# Patient Record
Sex: Female | Born: 1960 | Race: White | Hispanic: No | Smoking: Never smoker
Health system: Southern US, Community
[De-identification: ages and names within clinical notes are randomized; demographics above are authoritative.]

## PROBLEM LIST (undated history)

## (undated) DIAGNOSIS — D61818 Other pancytopenia: Secondary | ICD-10-CM

## (undated) DIAGNOSIS — B1920 Unspecified viral hepatitis C without hepatic coma: Secondary | ICD-10-CM

## (undated) DIAGNOSIS — R531 Weakness: Secondary | ICD-10-CM

## (undated) DIAGNOSIS — K219 Gastro-esophageal reflux disease without esophagitis: Secondary | ICD-10-CM

## (undated) DIAGNOSIS — Z9119 Patient's noncompliance with other medical treatment and regimen: Secondary | ICD-10-CM

## (undated) DIAGNOSIS — G5601 Carpal tunnel syndrome, right upper limb: Secondary | ICD-10-CM

## (undated) DIAGNOSIS — B180 Chronic viral hepatitis B with delta-agent: Secondary | ICD-10-CM

## (undated) DIAGNOSIS — K7460 Unspecified cirrhosis of liver: Secondary | ICD-10-CM

## (undated) DIAGNOSIS — R569 Unspecified convulsions: Secondary | ICD-10-CM

## (undated) DIAGNOSIS — E079 Disorder of thyroid, unspecified: Secondary | ICD-10-CM

## (undated) DIAGNOSIS — F1910 Other psychoactive substance abuse, uncomplicated: Secondary | ICD-10-CM

## (undated) DIAGNOSIS — I10 Essential (primary) hypertension: Secondary | ICD-10-CM

## (undated) DIAGNOSIS — I679 Cerebrovascular disease, unspecified: Secondary | ICD-10-CM

## (undated) DIAGNOSIS — F419 Anxiety disorder, unspecified: Secondary | ICD-10-CM

## (undated) DIAGNOSIS — F99 Mental disorder, not otherwise specified: Secondary | ICD-10-CM

## (undated) DIAGNOSIS — N181 Chronic kidney disease, stage 1: Secondary | ICD-10-CM

## (undated) DIAGNOSIS — D649 Anemia, unspecified: Secondary | ICD-10-CM

## (undated) DIAGNOSIS — L0390 Cellulitis, unspecified: Secondary | ICD-10-CM

## (undated) DIAGNOSIS — Z91199 Patient's noncompliance with other medical treatment and regimen due to unspecified reason: Secondary | ICD-10-CM

## (undated) HISTORY — PX: TONSILLECTOMY: SUR1361

## (undated) HISTORY — PX: ABDOMINAL HYSTERECTOMY: SHX81

## (undated) HISTORY — PX: FINGER AMPUTATION: SHX636

---

## 2015-08-13 ENCOUNTER — Emergency Department (HOSPITAL_COMMUNITY): Payer: Medicaid Other

## 2015-08-13 ENCOUNTER — Encounter (HOSPITAL_COMMUNITY): Payer: Self-pay

## 2015-08-13 ENCOUNTER — Inpatient Hospital Stay (HOSPITAL_COMMUNITY)
Admission: EM | Admit: 2015-08-13 | Discharge: 2015-08-18 | DRG: 101 | Disposition: A | Discharge status: Skilled Nursing Facility | Payer: Medicaid Other | Attending: Internal Medicine | Admitting: Internal Medicine

## 2015-08-13 DIAGNOSIS — F1410 Cocaine abuse, uncomplicated: Secondary | ICD-10-CM | POA: Diagnosis present

## 2015-08-13 DIAGNOSIS — G40401 Other generalized epilepsy and epileptic syndromes, not intractable, with status epilepticus: Principal | ICD-10-CM | POA: Diagnosis present

## 2015-08-13 DIAGNOSIS — D61818 Other pancytopenia: Secondary | ICD-10-CM | POA: Diagnosis present

## 2015-08-13 DIAGNOSIS — K739 Chronic hepatitis, unspecified: Secondary | ICD-10-CM | POA: Diagnosis present

## 2015-08-13 DIAGNOSIS — Z881 Allergy status to other antibiotic agents status: Secondary | ICD-10-CM

## 2015-08-13 DIAGNOSIS — G40909 Epilepsy, unspecified, not intractable, without status epilepticus: Secondary | ICD-10-CM

## 2015-08-13 DIAGNOSIS — Z888 Allergy status to other drugs, medicaments and biological substances status: Secondary | ICD-10-CM

## 2015-08-13 DIAGNOSIS — G8384 Todd's paralysis (postepileptic): Secondary | ICD-10-CM | POA: Diagnosis present

## 2015-08-13 DIAGNOSIS — Z8614 Personal history of Methicillin resistant Staphylococcus aureus infection: Secondary | ICD-10-CM

## 2015-08-13 DIAGNOSIS — Z9119 Patient's noncompliance with other medical treatment and regimen: Secondary | ICD-10-CM

## 2015-08-13 DIAGNOSIS — Z792 Long term (current) use of antibiotics: Secondary | ICD-10-CM

## 2015-08-13 DIAGNOSIS — K7469 Other cirrhosis of liver: Secondary | ICD-10-CM | POA: Diagnosis present

## 2015-08-13 DIAGNOSIS — Z89022 Acquired absence of left finger(s): Secondary | ICD-10-CM

## 2015-08-13 DIAGNOSIS — F1910 Other psychoactive substance abuse, uncomplicated: Secondary | ICD-10-CM | POA: Diagnosis present

## 2015-08-13 DIAGNOSIS — Z8619 Personal history of other infectious and parasitic diseases: Secondary | ICD-10-CM

## 2015-08-13 DIAGNOSIS — R4182 Altered mental status, unspecified: Secondary | ICD-10-CM | POA: Diagnosis present

## 2015-08-13 DIAGNOSIS — Z880 Allergy status to penicillin: Secondary | ICD-10-CM

## 2015-08-13 DIAGNOSIS — B180 Chronic viral hepatitis B with delta-agent: Secondary | ICD-10-CM | POA: Diagnosis present

## 2015-08-13 DIAGNOSIS — F419 Anxiety disorder, unspecified: Secondary | ICD-10-CM | POA: Diagnosis present

## 2015-08-13 DIAGNOSIS — I679 Cerebrovascular disease, unspecified: Secondary | ICD-10-CM | POA: Diagnosis present

## 2015-08-13 DIAGNOSIS — R569 Unspecified convulsions: Secondary | ICD-10-CM | POA: Insufficient documentation

## 2015-08-13 DIAGNOSIS — Z79891 Long term (current) use of opiate analgesic: Secondary | ICD-10-CM

## 2015-08-13 DIAGNOSIS — K219 Gastro-esophageal reflux disease without esophagitis: Secondary | ICD-10-CM | POA: Diagnosis present

## 2015-08-13 DIAGNOSIS — F4489 Other dissociative and conversion disorders: Secondary | ICD-10-CM | POA: Diagnosis not present

## 2015-08-13 DIAGNOSIS — Z7982 Long term (current) use of aspirin: Secondary | ICD-10-CM

## 2015-08-13 DIAGNOSIS — Z87892 Personal history of anaphylaxis: Secondary | ICD-10-CM

## 2015-08-13 DIAGNOSIS — R9089 Other abnormal findings on diagnostic imaging of central nervous system: Secondary | ICD-10-CM

## 2015-08-13 DIAGNOSIS — I739 Peripheral vascular disease, unspecified: Secondary | ICD-10-CM | POA: Diagnosis present

## 2015-08-13 DIAGNOSIS — R29810 Facial weakness: Secondary | ICD-10-CM | POA: Diagnosis present

## 2015-08-13 DIAGNOSIS — E039 Hypothyroidism, unspecified: Secondary | ICD-10-CM | POA: Diagnosis present

## 2015-08-13 DIAGNOSIS — I10 Essential (primary) hypertension: Secondary | ICD-10-CM | POA: Diagnosis present

## 2015-08-13 HISTORY — DX: Essential (primary) hypertension: I10

## 2015-08-13 HISTORY — DX: Mental disorder, not otherwise specified: F99

## 2015-08-13 HISTORY — DX: Chronic viral hepatitis B with delta-agent: K74.60

## 2015-08-13 HISTORY — DX: Patient's noncompliance with other medical treatment and regimen due to unspecified reason: Z91.199

## 2015-08-13 HISTORY — DX: Carpal tunnel syndrome, right upper limb: G56.01

## 2015-08-13 HISTORY — DX: Unspecified viral hepatitis C without hepatic coma: B19.20

## 2015-08-13 HISTORY — DX: Cellulitis, unspecified: L03.90

## 2015-08-13 HISTORY — DX: Weakness: R53.1

## 2015-08-13 HISTORY — DX: Patient's noncompliance with other medical treatment and regimen: Z91.19

## 2015-08-13 HISTORY — DX: Chronic kidney disease, stage 1: N18.1

## 2015-08-13 HISTORY — DX: Anemia, unspecified: D64.9

## 2015-08-13 HISTORY — DX: Anxiety disorder, unspecified: F41.9

## 2015-08-13 HISTORY — DX: Gastro-esophageal reflux disease without esophagitis: K21.9

## 2015-08-13 HISTORY — DX: Disorder of thyroid, unspecified: E07.9

## 2015-08-13 HISTORY — DX: Chronic viral hepatitis B with delta-agent: B18.0

## 2015-08-13 HISTORY — DX: Cerebrovascular disease, unspecified: I67.9

## 2015-08-13 HISTORY — DX: Other pancytopenia: D61.818

## 2015-08-13 HISTORY — DX: Unspecified convulsions: R56.9

## 2015-08-13 HISTORY — DX: Other psychoactive substance abuse, uncomplicated: F19.10

## 2015-08-13 LAB — RAPID URINE DRUG SCREEN, HOSP PERFORMED
Amphetamines: NOT DETECTED
BARBITURATES: NOT DETECTED
Benzodiazepines: POSITIVE — AB
Cocaine: NOT DETECTED
Opiates: NOT DETECTED
TETRAHYDROCANNABINOL: NOT DETECTED

## 2015-08-13 LAB — DIFFERENTIAL
BASOS ABS: 0 10*3/uL (ref 0.0–0.1)
BASOS PCT: 1 %
Eosinophils Absolute: 0.1 10*3/uL (ref 0.0–0.7)
Eosinophils Relative: 5 %
Lymphocytes Relative: 39 %
Lymphs Abs: 0.7 10*3/uL (ref 0.7–4.0)
MONOS PCT: 14 %
Monocytes Absolute: 0.2 10*3/uL (ref 0.1–1.0)
NEUTROS ABS: 0.8 10*3/uL — AB (ref 1.7–7.7)
Neutrophils Relative %: 41 %
SMEAR REVIEW: DECREASED

## 2015-08-13 LAB — CBC
HEMATOCRIT: 26.2 % — AB (ref 36.0–46.0)
HEMOGLOBIN: 8.4 g/dL — AB (ref 12.0–15.0)
MCH: 30.9 pg (ref 26.0–34.0)
MCHC: 32.1 g/dL (ref 30.0–36.0)
MCV: 96.3 fL (ref 78.0–100.0)
Platelets: 77 10*3/uL — ABNORMAL LOW (ref 150–400)
RBC: 2.72 MIL/uL — ABNORMAL LOW (ref 3.87–5.11)
RDW: 16.8 % — ABNORMAL HIGH (ref 11.5–15.5)
WBC: 1.8 10*3/uL — ABNORMAL LOW (ref 4.0–10.5)

## 2015-08-13 LAB — URINALYSIS, ROUTINE W REFLEX MICROSCOPIC
Bilirubin Urine: NEGATIVE
Glucose, UA: NEGATIVE mg/dL
Hgb urine dipstick: NEGATIVE
KETONES UR: NEGATIVE mg/dL
LEUKOCYTES UA: NEGATIVE
NITRITE: NEGATIVE
PROTEIN: NEGATIVE mg/dL
Specific Gravity, Urine: 1.02 (ref 1.005–1.030)
pH: 6.5 (ref 5.0–8.0)

## 2015-08-13 LAB — COMPREHENSIVE METABOLIC PANEL
ALT: 36 U/L (ref 14–54)
AST: 27 U/L (ref 15–41)
Albumin: 3.3 g/dL — ABNORMAL LOW (ref 3.5–5.0)
Alkaline Phosphatase: 71 U/L (ref 38–126)
Anion gap: 5 (ref 5–15)
BUN: 16 mg/dL (ref 6–20)
CHLORIDE: 111 mmol/L (ref 101–111)
CO2: 25 mmol/L (ref 22–32)
CREATININE: 0.78 mg/dL (ref 0.44–1.00)
Calcium: 8.7 mg/dL — ABNORMAL LOW (ref 8.9–10.3)
GFR calc Af Amer: >60 mL/min (ref >60)
GLUCOSE: 102 mg/dL — AB (ref 65–99)
Potassium: 3.8 mmol/L (ref 3.5–5.1)
SODIUM: 141 mmol/L (ref 135–145)
Total Bilirubin: 0.5 mg/dL (ref 0.3–1.2)
Total Protein: 6.5 g/dL (ref 6.5–8.1)

## 2015-08-13 LAB — TROPONIN I

## 2015-08-13 LAB — TYPE AND SCREEN
ABO/RH(D): A POS
Antibody Screen: NEGATIVE

## 2015-08-13 LAB — ETHANOL: Alcohol, Ethyl (B): <5 mg/dL (ref <5)

## 2015-08-13 LAB — PROTIME-INR
INR: 1.14 (ref 0.00–1.49)
Prothrombin Time: 14.8 seconds (ref 11.6–15.2)

## 2015-08-13 LAB — APTT: APTT: 28 s (ref 24–37)

## 2015-08-13 MED ORDER — OLANZAPINE 5 MG PO TABS
2.5000 mg | ORAL_TABLET | Freq: Two times a day (BID) | ORAL | Status: DC
  Administered 2015-08-13 – 2015-08-14 (×2): 5 mg via ORAL
  Filled 2015-08-13 (×2): qty 1

## 2015-08-13 MED ORDER — ASPIRIN EC 81 MG PO TBEC
81.0000 mg | DELAYED_RELEASE_TABLET | Freq: Every day | ORAL | Status: DC
  Administered 2015-08-14 – 2015-08-18 (×5): 81 mg via ORAL
  Filled 2015-08-13 (×5): qty 1

## 2015-08-13 MED ORDER — TRAZODONE HCL 50 MG PO TABS
50.0000 mg | ORAL_TABLET | Freq: Every day | ORAL | Status: DC
  Administered 2015-08-13 – 2015-08-15 (×3): 50 mg via ORAL
  Filled 2015-08-13 (×3): qty 1

## 2015-08-13 MED ORDER — MORPHINE SULFATE (PF) 2 MG/ML IV SOLN
2.0000 mg | INTRAVENOUS | Status: DC | PRN
Start: 2015-08-13 — End: 2015-08-18
  Administered 2015-08-14 – 2015-08-18 (×11): 2 mg via INTRAVENOUS
  Filled 2015-08-13 (×12): qty 1

## 2015-08-13 MED ORDER — HEPARIN SODIUM (PORCINE) 5000 UNIT/ML IJ SOLN
5000.0000 [IU] | Freq: Three times a day (TID) | INTRAMUSCULAR | Status: DC
  Administered 2015-08-14 (×2): 5000 [IU] via SUBCUTANEOUS
  Filled 2015-08-13 (×2): qty 1

## 2015-08-13 MED ORDER — ALPRAZOLAM 0.25 MG PO TABS
0.2500 mg | ORAL_TABLET | Freq: Three times a day (TID) | ORAL | Status: DC | PRN
  Administered 2015-08-14 – 2015-08-16 (×5): 0.25 mg via ORAL
  Filled 2015-08-13 (×5): qty 1

## 2015-08-13 MED ORDER — OXYCODONE HCL 5 MG PO TABS
10.0000 mg | ORAL_TABLET | ORAL | Status: DC | PRN
  Administered 2015-08-14 – 2015-08-18 (×13): 10 mg via ORAL
  Filled 2015-08-13 (×13): qty 2

## 2015-08-13 MED ORDER — LEVOTHYROXINE SODIUM 100 MCG PO TABS
200.0000 ug | ORAL_TABLET | Freq: Every day | ORAL | Status: DC
Start: 2015-08-14 — End: 2015-08-18
  Administered 2015-08-14 – 2015-08-18 (×5): 200 ug via ORAL
  Filled 2015-08-13 (×2): qty 2
  Filled 2015-08-13: qty 4
  Filled 2015-08-13 (×2): qty 2

## 2015-08-13 MED ORDER — SODIUM CHLORIDE 0.9% FLUSH
3.0000 mL | Freq: Two times a day (BID) | INTRAVENOUS | Status: DC
  Administered 2015-08-14 – 2015-08-18 (×6): 3 mL via INTRAVENOUS

## 2015-08-13 MED ORDER — LEVETIRACETAM 500 MG PO TABS
1000.0000 mg | ORAL_TABLET | Freq: Two times a day (BID) | ORAL | Status: DC
  Administered 2015-08-13 – 2015-08-14 (×3): 1000 mg via ORAL
  Filled 2015-08-13 (×3): qty 2

## 2015-08-13 NOTE — ED Provider Notes (Signed)
CSN: [id number]     Arrival date & time 08/13/15  1857 History   First MD Initiated Contact with Patient 08/13/15 1912     Chief Complaint  Patient presents with  . Facial Droop      HPI  Pt was seen at 1915. Per EMS and NH report, c/o gradual onset and persistence of constant right sided facial droop since last night. NH also states pt c/o left sided weakness last night. Symptoms began after she was evaluated at an OSH ED last night for "6 seizures in a 1/2 hour period." Pt was given keppra and d/c back to NH. NH states pt's symptoms began after she arrived back at the NH approximately 2000 last evening. EMS states pt walked to the ambulance PTA. Pt denies CP/palpitations, no SOB/cough, no abd pain, no N/V/D, no dysphagia, no slurred speech, no falls.     Past Medical History  Diagnosis Date  . Cellulitis   . Weakness generalized   . Anxiety   . Hypertension   . Hepatitis C   . 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 Thyroid disease   . GERD (gastroesophageal reflux disease)   . Cerebrovascular disease   . Anemia   . Substance abuse     cocaine, opiates  . Carpal tunnel syndrome of right wrist   . CKD (chronic kidney disease) stage 1, GFR 90 ml/min or greater   . Pancytopenia (HCC)   . Noncompliance   . Seizures (HCC)   . Mental health disorder    Past Surgical History  Procedure Laterality Date  . Finger amputation Left Index  . Abdominal hysterectomy    . Tonsillectomy      Social History  Substance Use Topics  . Smoking status: Never Smoker   . Smokeless tobacco: None  . Alcohol Use: No    Review of Systems ROS: Statement: All systems negative except as marked or noted in the HPI; Constitutional: Negative for fever and chills. ; ; Eyes: Negative for eye pain, redness and discharge. ; ; ENMT: Negative for ear pain, hoarseness, nasal congestion, sinus pressure and sore throat. ; ; Cardiovascular: Negative for chest pain, palpitations,  diaphoresis, dyspnea and peripheral edema. ; ; Respiratory: Negative for cough, wheezing and stridor. ; ; Gastrointestinal: Negative for nausea, vomiting, diarrhea, abdominal pain, blood in stool, hematemesis, jaundice and rectal bleeding. . ; ; Genitourinary: Negative for dysuria, flank pain and hematuria. ; ; Musculoskeletal: Negative for back pain and neck pain. Negative for swelling and trauma.; ; Skin: Negative for pruritus, rash, abrasions, blisters, bruising and skin lesion.; ; Neuro: +right facial droop, left sided weakness. Negative for headache, lightheadedness and neck stiffness. Negative for altered level of consciousness , altered mental status, paresthesias, involuntary movement, seizure and syncope.      Allergies  Other; Penicillin g; Vancomycin; Penicillins; and Seroquel  Home Medications   Prior to Admission medications   Medication Sig Start Date End Date Taking? Authorizing Provider  ALPRAZolam (XANAX) 0.25 MG tablet Take 0.25 mg by mouth every 8 (eight) hours as needed for anxiety.   Yes Historical Provider, MD  aspirin EC 81 MG tablet Take 81 mg by mouth daily.   Yes Historical Provider, MD  Chlorhexidine Gluconate 2 % SOLN Apply 1 application topically 3 (three) times daily. For cellulitis   Yes Historical Provider, MD  ferrous sulfate 325 (65 FE) MG tablet Take 325 mg by mouth daily with breakfast.   Yes Historical Provider, MD  levETIRAcetam (KEPPRA) 1000 MG tablet Take 1,000 mg by mouth 2 (two) times daily.   Yes Historical Provider, MD  levothyroxine (SYNTHROID, LEVOTHROID) 200 MCG tablet Take 200 mcg by mouth daily before breakfast.   Yes Historical Provider, MD  naproxen (NAPROSYN) 375 MG tablet Take 375 mg by mouth 2 (two) times daily with a meal.   Yes Historical Provider, MD  OLANZapine (ZYPREXA) 5 MG tablet Take 2.5-5 mg by mouth 2 (two) times daily. Take 2.5mg  in the morning and 5mg  at bedtime   Yes Historical Provider, MD  oxyCODONE (OXY IR/ROXICODONE) 5 MG  immediate release tablet Take 10 mg by mouth every 4 (four) hours as needed for moderate pain or severe pain.   Yes Historical Provider, MD  traZODone (DESYREL) 50 MG tablet Take 50 mg by mouth at bedtime.   Yes Historical Provider, MD   BP 149/96 mmHg  Pulse 59  Temp(Src) 97.8 F (36.6 C) (Oral)  Resp 15  Ht 5\' 7"  (1.702 m)  Wt 139 lb (63.05 kg)  BMI 21.77 kg/m2  SpO2 100% Physical Exam  1920: Physical examination:  Nursing notes reviewed; Vital signs and O2 SAT reviewed;  Constitutional: Well developed, Well nourished, Well hydrated, In no acute distress; Head:  Normocephalic, atraumatic; Eyes: EOMI, PERRL, No scleral icterus; ENMT: Mouth and pharynx normal, Mucous membranes moist; Neck: Supple, Full range of motion, No lymphadenopathy; Cardiovascular: Regular rate and rhythm, No gallop; Respiratory: Breath sounds clear & equal bilaterally, No wheezes.  Speaking full sentences with ease, Normal respiratory effort/excursion; Chest: Nontender, Movement normal; Abdomen: Soft, Nontender, Nondistended, Normal bowel sounds; Genitourinary: No CVA tenderness; Extremities: Pulses normal, +DSD left hand. No tenderness, No edema, No calf edema or asymmetry.; Neuro: AA&Ox3, vague historian. Speech clear. +right facial droop. Grips equal. Strength 5/5 equal bilat UE's and LE's. No gross focal motor or sensory deficits in extremities.; Skin: Color normal, Warm, Dry.   ED Course  Procedures (including critical care time) Labs Review  Imaging Review  I have personally reviewed and evaluated these images and lab results as part of my medical decision-making.   EKG Interpretation   Date/Time:  Thursday August 13 2015 19:05:55 EST Ventricular Rate:  58 PR Interval:  150 QRS Duration: 95 QT Interval:  433 QTC Calculation: 425 R Axis:   63 Text Interpretation:  Sinus rhythm Artifact No old tracing to compare  Confirmed by [facility]  MD, Nicholos Johns [phone]) on 08/13/2015 7:21:45 PM      MDM   MDM Reviewed: previous chart, nursing note and vitals Reviewed previous: labs and ECG Interpretation: labs, ECG, x-ray and MRI     Results for orders placed or performed during the hospital encounter of 08/13/15  Ethanol  Result Value Ref Range   Alcohol, Ethyl (B) <5 <5 mg/dL  Protime-INR  Result Value Ref Range   Prothrombin Time 14.8 11.6 - 15.2 seconds   INR 1.14 0.00 - 1.49  APTT  Result Value Ref Range   aPTT 28 24 - 37 seconds  CBC  Result Value Ref Range   WBC 1.8 (L) 4.0 - 10.5 K/uL   RBC 2.72 (L) 3.87 - 5.11 MIL/uL   Hemoglobin 8.4 (L) 12.0 - 15.0 g/dL   HCT 60.4 (L) 54.0 - 98.1 %   MCV 96.3 78.0 - 100.0 fL   MCH 30.9 26.0 - 34.0 pg   MCHC 32.1 30.0 - 36.0 g/dL   RDW 19.1 (H) 47.8 - 29.5 %   Platelets 77 (L) 150 - 400 K/uL  Differential  Result Value Ref Range   Neutrophils Relative % 41 %   Neutro Abs 0.8 (L) 1.7 - 7.7 K/uL   Lymphocytes Relative 39 %   Lymphs Abs 0.7 0.7 - 4.0 K/uL   Monocytes Relative 14 %   Monocytes Absolute 0.2 0.1 - 1.0 K/uL   Eosinophils Relative 5 %   Eosinophils Absolute 0.1 0.0 - 0.7 K/uL   Basophils Relative 1 %   Basophils Absolute 0.0 0.0 - 0.1 K/uL   WBC Morphology WHITE COUNT CONFIRMED ON SMEAR    RBC Morphology TEARDROP CELLS    Smear Review PLATELETS APPEAR DECREASED   Comprehensive metabolic panel  Result Value Ref Range   Sodium 141 135 - 145 mmol/L   Potassium 3.8 3.5 - 5.1 mmol/L   Chloride 111 101 - 111 mmol/L   CO2 25 22 - 32 mmol/L   Glucose, Bld 102 (H) 65 - 99 mg/dL   BUN 16 6 - 20 mg/dL   Creatinine, Ser 4.78 0.44 - 1.00 mg/dL   Calcium 8.7 (L) 8.9 - 10.3 mg/dL   Total Protein 6.5 6.5 - 8.1 g/dL   Albumin 3.3 (L) 3.5 - 5.0 g/dL   AST 27 15 - 41 U/L   ALT 36 14 - 54 U/L   Alkaline Phosphatase 71 38 - 126 U/L   Total Bilirubin 0.5 0.3 - 1.2 mg/dL   GFR calc non Af Amer >60 >60 mL/min   GFR calc Af Amer >60 >60 mL/min   Anion gap 5 5 - 15  Urine rapid drug screen (hosp performed)not at [facility]  Result  Value Ref Range   Opiates NONE DETECTED NONE DETECTED   Cocaine NONE DETECTED NONE DETECTED   Benzodiazepines POSITIVE (A) NONE DETECTED   Amphetamines NONE DETECTED NONE DETECTED   Tetrahydrocannabinol NONE DETECTED NONE DETECTED   Barbiturates NONE DETECTED NONE DETECTED  Urinalysis, Routine w reflex microscopic (not at [facility])  Result Value Ref Range   Color, Urine YELLOW YELLOW   APPearance CLEAR CLEAR   Specific Gravity, Urine 1.020 1.005 - 1.030   pH 6.5 5.0 - 8.0   Glucose, UA NEGATIVE NEGATIVE mg/dL   Hgb urine dipstick NEGATIVE NEGATIVE   Bilirubin Urine NEGATIVE NEGATIVE   Ketones, ur NEGATIVE NEGATIVE mg/dL   Protein, ur NEGATIVE NEGATIVE mg/dL   Nitrite NEGATIVE NEGATIVE   Leukocytes, UA NEGATIVE NEGATIVE   Mr Brain Wo Contrast (neuro Protocol) 08/13/2015  CLINICAL DATA:  55 year old hypertensive female with history of substance abuse and anemia presenting with left-sided facial droop. Initial encounter. EXAM: MRI HEAD WITHOUT CONTRAST TECHNIQUE: Multiplanar, multiecho pulse sequences of the brain and surrounding structures were obtained without intravenous contrast. COMPARISON:  None. FINDINGS: Exam is motion degraded. No acute infarct or intracranial hemorrhage. Right cerebral peduncle 5 x 4 x 4 mm cyst of indeterminate etiology. Subinsular subtle altered signal intensity may be related to small vessel disease/ motion without other findings to suggest herpes encephalitis. Clinical correlation recommended. Mild nonspecific white matter changes most prominent periventricular region, suggestive of result of small vessel disease in this hypertensive patient. Major intracranial vascular structures are patent. No hydrocephalus or age advanced atrophy. Cervical medullary junction, pituitary region, pineal region and orbital structures unremarkable. IMPRESSION: Exam is motion degraded. No acute infarct or intracranial hemorrhage. Subinsular subtle altered signal intensity may be related to  small vessel disease/ motion without other findings to suggest herpes encephalitis. Clinical correlation recommended. Right cerebral peduncle 5 x 4 x 4 mm cystic appearing structure probably an  incidental finding such as a prominent peri vascular space. If there were progressive symptoms, this could be evaluated on follow-up exam. Mild small vessel disease. These results were called by telephone at the time of interpretation on 08/13/2015 at 8:24 pm to [facility] ER nurse , who verbally acknowledged these results. Electronically Signed   By: Lacy Duverney M.D.   On: 08/13/2015 20:28    2110:  [facility] Records requested. Pt's neuro exam unchanged. T/C from [facility] Dr. Constance Goltz: states there was much motion artifact on MRI which will affect reading. T/C to [facility] Neuro Dr. Amada Jupiter, case discussed, including:  HPI, pertinent PM/SHx, VS/PE, dx testing, ED course and treatment:  He has viewed the MRI images, believes what is being looked at is motion artifact; facial issue could be Todd's paralysis if it began after seizures last night, requests to observation admit to [facility] tonight and have [facility] Neuro MD evaluate pt tomorrow.     2230:  T/C to [facility] Dr. Conley Rolls, case discussed, including:  HPI, pertinent PM/SHx, VS/PE, dx testing, ED course and treatment:  Agreeable to come to ED for evaluation to observation admit, requests to write temporary orders, obtain tele bed to team [facility].    Samuel Jester, DO 08/16/15 1530

## 2015-08-13 NOTE — ED Notes (Signed)
MD at bedside. 

## 2015-08-13 NOTE — ED Notes (Signed)
3 unsuccessful attempts to obtain IV.  

## 2015-08-13 NOTE — ED Notes (Signed)
EMS reports patient was seen at [facility] last night for 6 seizures and given Keppra. Per EMS patient then had left sided facial droop that started approx. Last night at 2000 after seizure activity. Patient complains of left side "not feeling right", and left hand pain. Patient is post-op 3 weeks of left index amputation r/t cellulitis from [facility]. Last had pain medication at 1800. Patient alert and oriented x4. Strength and grips unremarkable.

## 2015-08-13 NOTE — H&P (Signed)
[facility] History and Physical  Tonilynn Bieker [name] DOB: 01/23/1961    PCP:   No primary care provider on file.   Chief Complaint: LUE pain, seizures, facial drop  HPI: Rhonda Vargas is an 55 y.o. female   With a hx of pancytopenia, Sz, and drug abuse, was brought to the hospital via EMS. EMS reports that pt was seen last night at [facility] [address] for 6 seizures. Patient was given Keppra. Then, last night at approximately 8pm, pt began to experience left sided facial droop. Additionally, pt complains of pain in RUE and unusaual feeling. Patient is p/o 3 weeks of left index amputation r/t cellulitis.  Pt reports she has been taking Sz medication as normal, but has recently been experiencing headaches, and has been  disoriented onset last month, but does not appear to be so presently. She has been taking [facility] medication at [facility]. States that she has been been experiencing dyspnea and generalized weakness recently. She also admits to a hx of crack cocaine. She has been admitted for further management.  While in the ED, workup revealed abnormal MRI with subinsular subtle altered signal intensity may be related to small vessel disease/ motion artifact, without other findings to suggest herpes encephalitis. Clinical correlation recommended. Additionally, Right cerebral peduncle 5 x 4 x 4 mm cystic appearing structure probably an incidental finding such as a prominent peri vascular space. If there were progressive symptoms, this could be evaluated on follow-up exam. Mild small vessel disease.. WBC mildly decreased. Hgb and htc mildly decreased. VSS.  EDP spoke with Dr Amada Jupiter of neurology, who looked at the MR image, and also felt that it was an artifact.  He brought up possibility of Todd's paralysis.  Hospitalist was subsequently asked to admit this patient for further work up, and to have neurology consultation.     Rewiew of Systems:   Constitutional: Negative for malaise, fever and  chills. No significant weight loss or weight gain Eyes: Negative for eye pain, redness and discharge, diplopia, visual changes, or flashes of light. ENMT: Negative for ear pain, hoarseness, nasal congestion, sinus pressure and sore throat.; tinnitus, drooling, or problem swallowing. Positive for cephalgia  Cardiovascular: Negative for chest pain, palpitations, diaphoresis, dyspnea and peripheral edema. ; No orthopnea, PND Respiratory: Negative for cough, hemoptysis, wheezing and stridor. No pleuritic chestpain. Gastrointestinal: Negative for nausea, vomiting, diarrhea, constipation, abdominal pain, melena, blood in stool, hematemesis, jaundice and rectal bleeding.    Genitourinary: Negative for frequency, dysuria, incontinence,flank pain and hematuria; Musculoskeletal: Negative for back pain and neck pain. Negative for swelling and trauma.;  Skin: . Negative for pruritus, rash, abrasions, bruising and skin lesion.; ulcerations. Positive for LLE pustule Neuro: Negative for headache, lightheadedness and neck stiffness. Negative for weakness, altered level of consciousness , extremity weakness, burning feet, involuntary movement, seizure and syncope. Positive for intermittent AMS Psych: negative for anxiety, depression, insomnia, tearfulness, panic attacks, hallucinations, paranoia, suicidal or homicidal ideation    Past Medical History  Diagnosis Date  . Cellulitis   . Weakness generalized   . Anxiety   . Hypertension   . Hepatitis C   . 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 Thyroid disease   . GERD (gastroesophageal reflux disease)   . Cerebrovascular disease   . Anemia   . Substance abuse     cocaine, opiates  . Carpal tunnel syndrome of right wrist   . CKD (chronic kidney disease) stage 1, GFR 90 ml/min or greater   .  Pancytopenia (HCC)   . Noncompliance   . Seizures (HCC)   . Mental health disorder     Past Surgical History  Procedure Laterality Date  .  Finger amputation Left Index  . Abdominal hysterectomy    . Tonsillectomy      Medications:  HOME MEDS: Prior to Admission medications   Medication Sig Start Date End Date Taking? Authorizing Provider  ALPRAZolam (XANAX) 0.25 MG tablet Take 0.25 mg by mouth every 8 (eight) hours as needed for anxiety.   Yes Historical Provider, MD  aspirin EC 81 MG tablet Take 81 mg by mouth daily.   Yes Historical Provider, MD  Chlorhexidine Gluconate 2 % SOLN Apply 1 application topically 3 (three) times daily. For cellulitis   Yes Historical Provider, MD  ferrous sulfate 325 (65 FE) MG tablet Take 325 mg by mouth daily with breakfast.   Yes Historical Provider, MD  levETIRAcetam (KEPPRA) 1000 MG tablet Take 1,000 mg by mouth 2 (two) times daily.   Yes Historical Provider, MD  levothyroxine (SYNTHROID, LEVOTHROID) 200 MCG tablet Take 200 mcg by mouth daily before breakfast.   Yes Historical Provider, MD  naproxen (NAPROSYN) 375 MG tablet Take 375 mg by mouth 2 (two) times daily with a meal.   Yes Historical Provider, MD  OLANZapine (ZYPREXA) 5 MG tablet Take 2.5-5 mg by mouth 2 (two) times daily. Take 2.5mg  in the morning and  at bedtime   Yes Historical Provider, MD  oxyCODONE (OXY IR/ROXICODONE) 5 MG immediate release tablet Take 10 mg by mouth every 4 (four) hours as needed for moderate pain or severe pain.   Yes Historical Provider, MD  traZODone (DESYREL) 50 MG tablet Take 50 mg by mouth at bedtime.   Yes Historical Provider, MD     Allergies:  Allergies  Allergen Reactions  . Other Swelling    Mouth Swelling Mouth Swelling  . Penicillin G Anaphylaxis  . Vancomycin Other (See Comments)    Redman's reaction - tolerating with diphenhydramine pre-med and extended infusion (  over 1h) 07/22/15 jbarrow rph  . Penicillins Rash    Has patient had a PCN reaction causing immediate rash, facial/tongue/throat swelling, SOB or lightheadedness with hypotension: Yes Has patient had a PCN reaction  causing severe rash involving mucus membranes or skin necrosis: No Has patient had a PCN reaction that required hospitalization No Has patient had a PCN reaction occurring within the last 10 years: No If all of the above answers are "NO", then may proceed with Cephalosporin use.   Marland Kitchen Seroquel [Quetiapine Fumarate] Rash    Social History:   reports that she has never smoked. She does not have any smokeless tobacco history on file. She reports that she uses illicit drugs (Cocaine). She reports that she does not drink alcohol.  Family History: No family history on file.   Physical Exam: Filed Vitals:   08/13/15 1907  BP: 149/96  Pulse: 59  Temp: 97.8 F (36.6 C)  TempSrc: Oral  Resp: 15  Height:  (1.702 m)  Weight: 63.05 kg (139 lb)  SpO2: 100%   Blood pressure 149/96, pulse 59, temperature 97.8 F (36.6 C), temperature source Oral, resp. rate 15, height  (1.702 m), weight 63.05 kg (139 lb), SpO2 100 %.  GEN:  Pleasant, patient lying in the stretcher in no acute distress; cooperative with exam. PSYCH:  alert and oriented x4; does not appear anxious or depressed; affect is appropriate. Possible schizophrenia HEENT: Mucous membranes pink and anicteric; PERRLA; EOM  intact; no cervical lymphadenopathy nor thyromegaly or carotid bruit; no JVD; There were no stridor. Neck is very supple. Breasts:: Not examined CHEST WALL: No tenderness CHEST: Normal respiration, clear to auscultation bilaterally.  HEART: Regular rate and rhythm.  There are no murmur, rub, or gallops.   BACK: No kyphosis or scoliosis; no CVA tenderness ABDOMEN: soft and non-tender; no masses, no organomegaly, normal abdominal bowel sounds; no pannus; no intertriginous candida. There is no rebound and no distention. Rectal Exam: Not done EXTREMITIES: No bone or joint deformity; age-appropriate arthropathy of the hands and knees; no edema; no ulcerations.  There is no calf tenderness. Genitalia: not  examined PULSES: 2+ and symmetric SKIN: Normal hydration no rash or ulceration. LLE pustule CNS: Cranial nerves 2-12 grossly intact no focal lateralizing neurologic deficit.  Speech is fluent; uvula elevated with phonation, facial symmetry and tongue midline. DTR are normal bilaterally, cerebella exam is intact, barbinski is negative and strengths are equaled bilaterally.  No sensory loss.   Labs on Admission:  Basic Metabolic Panel:  Recent Labs Lab 08/13/15 2120  NA 141  K 3.8  CL 111  CO2 25  GLUCOSE 102*  BUN 16  CREATININE 0.78  CALCIUM 8.7*   Liver Function Tests:  Recent Labs Lab 08/13/15 2120  AST 27  ALT 36  ALKPHOS 71  BILITOT 0.5  PROT 6.5  ALBUMIN 3.3*   CBC:  Recent Labs Lab 08/13/15 2120  WBC 1.8*  NEUTROABS 0.8*  HGB 8.4*  HCT 26.2*  MCV 96.3  PLT 77*   Radiological Exams on Admission: Mr Brain Wo Contrast (neuro Protocol)  08/13/2015  CLINICAL DATA:  55 year old hypertensive female with history of substance abuse and anemia presenting with left-sided facial droop. Initial encounter. EXAM: MRI HEAD WITHOUT CONTRAST TECHNIQUE: Multiplanar, multiecho pulse sequences of the brain and surrounding structures were obtained without intravenous contrast. COMPARISON:  None. FINDINGS: Exam is motion degraded. No acute infarct or intracranial hemorrhage. Right cerebral peduncle 5 x 4 x 4 mm cyst of indeterminate etiology. Subinsular subtle altered signal intensity may be related to small vessel disease/ motion without other findings to suggest herpes encephalitis. Clinical correlation recommended. Mild nonspecific white matter changes most prominent periventricular region, suggestive of result of small vessel disease in this hypertensive patient. Major intracranial vascular structures are patent. No hydrocephalus or age advanced atrophy. Cervical medullary junction, pituitary region, pineal region and orbital structures unremarkable. IMPRESSION: Exam is motion  degraded. No acute infarct or intracranial hemorrhage. Subinsular subtle altered signal intensity may be related to small vessel disease/ motion without other findings to suggest herpes encephalitis. Clinical correlation recommended. Right cerebral peduncle 5 x 4 x 4 mm cystic appearing structure probably an incidental finding such as a prominent peri vascular space. If there were progressive symptoms, this could be evaluated on follow-up exam. Mild small vessel disease. These results were called by telephone at the time of interpretation on 08/13/2015 at 8:24 pm to [facility] ER nurse , who verbally acknowledged these results. Electronically Signed   By: Lacy Duverney M.D.   On: 08/13/2015 20:28    EKG: Independently reviewed.    Assessment/Plan 1. Altered mental status, with MRI likely artifacts:  Will not initiate Tx for HSVE.  She actually has no confusion when I spoke with her, and that she is quite lucid. Consult neurology, though her symptoms are symptoms without medical explanation.  WIll watch her for seizure and continue with Keppra.   2. Sz, will continue Keppra and observe overnight 3.  Facial droop, possibly Todd's paralysis. MRI did not show signs of stroke 4. LLE pustule, will perform blood Cx to rule out embolization, as she said the lower extremity pustule came on quickly.  5. Pancytopenia:  Not new, though I don't know her baseline.  Will follow.    Other plans as per orders. Code Status: Full Code.    Houston Siren, MD. Jerrel Ivory.  [facility] Pager [phone] 7pm to 7am.  08/13/2015, 10:28 PM   By signing my name below, I, Adron Bene, attest that this documentation has been prepared under the direction and in the presence of Houston Siren, MD. Electronically Signed: Adron Bene, Scribe 08/13/2015  10:28pm

## 2015-08-14 ENCOUNTER — Encounter (HOSPITAL_COMMUNITY): Payer: Self-pay | Admitting: *Deleted

## 2015-08-14 ENCOUNTER — Observation Stay (HOSPITAL_COMMUNITY): Payer: Medicaid Other

## 2015-08-14 DIAGNOSIS — R29810 Facial weakness: Secondary | ICD-10-CM | POA: Diagnosis not present

## 2015-08-14 LAB — BASIC METABOLIC PANEL
Anion gap: 6 (ref 5–15)
BUN: 16 mg/dL (ref 6–20)
CALCIUM: 8.9 mg/dL (ref 8.9–10.3)
CHLORIDE: 112 mmol/L — AB (ref 101–111)
CO2: 26 mmol/L (ref 22–32)
CREATININE: 0.72 mg/dL (ref 0.44–1.00)
GFR calc Af Amer: >60 mL/min (ref >60)
GFR calc non Af Amer: >60 mL/min (ref >60)
Glucose, Bld: 91 mg/dL (ref 65–99)
Potassium: 3.6 mmol/L (ref 3.5–5.1)
SODIUM: 144 mmol/L (ref 135–145)

## 2015-08-14 LAB — MRSA PCR SCREENING: MRSA by PCR: NEGATIVE

## 2015-08-14 LAB — CBC
HCT: 25 % — ABNORMAL LOW (ref 36.0–46.0)
Hemoglobin: 8 g/dL — ABNORMAL LOW (ref 12.0–15.0)
MCH: 30.8 pg (ref 26.0–34.0)
MCHC: 32 g/dL (ref 30.0–36.0)
MCV: 96.2 fL (ref 78.0–100.0)
PLATELETS: 84 10*3/uL — AB (ref 150–400)
RBC: 2.6 MIL/uL — ABNORMAL LOW (ref 3.87–5.11)
RDW: 16.9 % — AB (ref 11.5–15.5)
WBC: 2 10*3/uL — ABNORMAL LOW (ref 4.0–10.5)

## 2015-08-14 LAB — TSH: TSH: 0.507 u[IU]/mL (ref 0.350–4.500)

## 2015-08-14 MED ORDER — OLANZAPINE 2.5 MG PO TABS
2.5000 mg | ORAL_TABLET | Freq: Every day | ORAL | Status: DC
  Administered 2015-08-15 – 2015-08-16 (×2): 2.5 mg via ORAL
  Filled 2015-08-14 (×2): qty 1

## 2015-08-14 MED ORDER — OLANZAPINE 5 MG PO TABS
5.0000 mg | ORAL_TABLET | Freq: Every day | ORAL | Status: DC
  Administered 2015-08-14 – 2015-08-17 (×4): 5 mg via ORAL
  Filled 2015-08-14 (×6): qty 1

## 2015-08-14 MED ORDER — LEVETIRACETAM 750 MG PO TABS
1500.0000 mg | ORAL_TABLET | Freq: Two times a day (BID) | ORAL | Status: DC
  Administered 2015-08-15 – 2015-08-18 (×7): 1500 mg via ORAL
  Filled 2015-08-14: qty 3
  Filled 2015-08-14: qty 2
  Filled 2015-08-14 (×2): qty 3
  Filled 2015-08-14 (×3): qty 2

## 2015-08-14 NOTE — ED Notes (Signed)
Continues to have right sided facial drooping.  Denies any problems swallowing. C/o left leg weakness.  States she has felt left sided weakness since Monday.  Drooping started yesterday.

## 2015-08-14 NOTE — ED Notes (Signed)
Patient used bedpan to void. Patient asking for pain medication at this time.
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
## 2015-08-14 NOTE — ED Notes (Signed)
RN from [facility] at bedside for PICC placement.

## 2015-08-15 DIAGNOSIS — D61818 Other pancytopenia: Secondary | ICD-10-CM | POA: Diagnosis present

## 2015-08-15 DIAGNOSIS — R29810 Facial weakness: Secondary | ICD-10-CM | POA: Diagnosis not present

## 2015-08-15 DIAGNOSIS — I10 Essential (primary) hypertension: Secondary | ICD-10-CM

## 2015-08-15 DIAGNOSIS — R569 Unspecified convulsions: Secondary | ICD-10-CM | POA: Diagnosis not present

## 2015-08-15 DIAGNOSIS — F419 Anxiety disorder, unspecified: Secondary | ICD-10-CM | POA: Diagnosis not present

## 2015-08-15 LAB — GLUCOSE, CAPILLARY: Glucose-Capillary: 132 mg/dL — ABNORMAL HIGH (ref 65–99)

## 2015-08-15 MED ORDER — LORAZEPAM 2 MG/ML IJ SOLN
1.0000 mg | Freq: Once | INTRAMUSCULAR | Status: AC
  Administered 2015-08-15: 1 mg via INTRAVENOUS
  Filled 2015-08-15: qty 1

## 2015-08-15 MED ORDER — ENSURE ENLIVE PO LIQD
237.0000 mL | Freq: Two times a day (BID) | ORAL | Status: DC
  Administered 2015-08-15 – 2015-08-18 (×6): 237 mL via ORAL
  Filled 2015-08-15 (×7): qty 237

## 2015-08-15 NOTE — Progress Notes (Signed)
Pt actively having seizures. Notified MD.

## 2015-08-15 NOTE — Progress Notes (Signed)
Pt given Xanax. NP states to give Keppra early and to given pain medication at this time in addition to the Xanax. Will continue to monitor pt.

## 2015-08-15 NOTE — Progress Notes (Signed)
Pt having active seizures. Vital signs taken, blood sugar taken. All within defined limits. Notified midlevel. Awaiting response.

## 2015-08-15 NOTE — Progress Notes (Signed)
PROGRESS NOTE  Rhonda Vargas ZOX:[id number] DOB: Jul 08, 1960 DOA: 08/13/2015 PCP: No primary care provider on file.  Summary: 55yow PMH seizure, cocaine use, recent amputation left index finger for cellulitis, [facility], given Keppra and discharged. Then approx 3/1 developed left-sided facial droop, RUE pain and paraesthesia. Reported compliance with seizure medication.  MRI brain abnormal and EDP d/w Dr. Amada JupiterKirkpatrick neurology who felt abnormalities on MRI were artifact, suggested Todd's paralysis and recommended admit to [facility]. Admitted for abnromal MRI, no evidence of AMS on admission, seizure disorder, facial dropp  Assessment/Plan: 1. Seizure disorder. On Xanax, Keppra. U/A negative. She did have one seizure today. She was seen by neurology last night and keppra was increased to 1500mg  bid. Patient received first dose of keppra this morning, so we cannot call this failure of treatment. She will need to be monitored another 24 hours. If she has recurrence of further seizures, may need to discuss further treatment with neurology. If she remains seizure free, can likely discharge to SNF tomorrow on higher dose of keppra. 2. Right sided facial weakness, left lower extremity weakness, abnormal MRI, felt to be artifact. Neurology suggested Todd's paralysis. 3. Pancytopenia, stable, chronic by history, review of labs in Care everywhere indicate that she has had intermittent pancytopenia for many years now. She can follow up with hematology as an outpatient for further work up. 4. PMH hepatitis C, chronic hepatitis B, cirrhosis [facility] 5. Cocaine use. UDS with BZD. Reports no cocaine use in one year. 6. Hypothyroidism. TSH normal. 7. Post-op 3 weeks of left index amputation [facility] for MRSA. Seen in clinic 2/27 at that time thought to be doing well off vancomycin. [facility]  Code Status: full code DVT prophylaxis:  scd Family Communication: none present Disposition Plan: return to SNF  Erick BlinksJehanzeb Delesia Martinek, MD  [facility]  Pager [phone] If 7PM-7AM, please contact night-coverage at [email or web address], password [email or web address] 08/15/2015, 12:35 PM    Consultants:  Neurology   Procedures:    Antibiotics:    HPI/Subjective: Patient reportedly had a seizure this morning. She complains of feeling "run down". Denies any other complaints.  Objective: Filed Vitals:   08/14/15 1330 08/14/15 1436 08/14/15 2136 08/15/15 0434  BP: 130/77 112/62 107/59 118/68  Pulse: 57 62 62 56  Temp:  98.4 F (36.9 C) 98.6 F (37 C) 98.3 F (36.8 C)  TempSrc:  Oral Oral Oral  Resp: 18 16 20 20   Height:      Weight:  63.957 kg (141 lb)    SpO2: 98% 96% 95% 97%    Intake/Output Summary (Last 24 hours) at 08/15/15 1235 Last data filed at 08/15/15 0936  Gross per 24 hour  Intake    840 ml  Output      0 ml  Net    840 ml     Filed Weights   08/13/15 1907 08/14/15 1436  Weight: 63.05 kg (139 lb) 63.957 kg (141 lb)    Exam:    General:  Appears calm and comfortable Eyes: PERRL, normal lids, irises  ENT: grossly normal hearing, lips & tongue Cardiovascular: RRR, no m/r/g. No LE edema. Respiratory: CTA bilaterally, no w/r/r. Normal respiratory effort. Skin: Left hand, is dressed with gauze which was not removed. Musculoskeletal: grossly normal tone BUE/BLE. Strength left leg 4+/5, 5/5 on the right. Upper extremity strength laterally appears symmetric and grossly normal. Psychiatric: grossly normal mood and affect, speech fluent and  appropriate Neurologic: Right-sided facial weakness is noted.   Scheduled Meds: . aspirin EC  81 mg Oral Daily  . levETIRAcetam  1,500 mg Oral BID  . levothyroxine  200 mcg Oral QAC breakfast  . OLANZapine  2.5 mg Oral Daily  . OLANZapine  5 mg Oral QHS  . sodium chloride flush  3 mL Intravenous Q12H  . traZODone  50 mg Oral QHS   Continuous Infusions:   Principal  Problem:   Seizures (HCC) Active Problems:   Facial droop   Confusion state   Substance abuse   Anxiety   HTN (hypertension)   Chronic hepatitis ([facility])   Altered mental status   Pancytopenia ([facility])   Time spent 25 minutes

## 2015-08-15 NOTE — Progress Notes (Signed)
Pt had a witnessed seizure of about 1.5 minutes. She was speaking to housekeeping staff and suddenly seized.  After seizure pt is A&O c/o dizziness.  Dr. Kerry HoughMemon notified.

## 2015-08-15 NOTE — Progress Notes (Signed)
Nutrition Brief Note  Patient identified on the Malnutrition Screening Tool (MST) Report  Wt Readings from Last 15 Encounters:  08/14/15 141 lb (63.957 kg)  08/13/15 139 lb (63.05 kg)    Body mass index is 22.08 kg/(m^2). Patient meets criteria for  Healthy Wt-to-Ht based on current BMI.   Pt presents from snf w/ reports of seizure. Was slated to D/C home today, but had a seizure and is to be observed for another 24 hours.   Current diet order is heart healthy, patient is consuming approximately 100% of meals at this time.   Labs and medications reviewed.   No nutrition interventions warranted at this time. If nutrition issues arise, please consult RD.   Christophe LouisNathan Dhairya Corales RD, LDN Nutrition Pager: [phone] 08/15/2015 3:00 PM

## 2015-08-16 ENCOUNTER — Inpatient Hospital Stay (HOSPITAL_COMMUNITY): Payer: Medicaid Other

## 2015-08-16 DIAGNOSIS — Z9119 Patient's noncompliance with other medical treatment and regimen: Secondary | ICD-10-CM | POA: Diagnosis not present

## 2015-08-16 DIAGNOSIS — I739 Peripheral vascular disease, unspecified: Secondary | ICD-10-CM | POA: Diagnosis present

## 2015-08-16 DIAGNOSIS — Z888 Allergy status to other drugs, medicaments and biological substances status: Secondary | ICD-10-CM | POA: Diagnosis not present

## 2015-08-16 DIAGNOSIS — Z881 Allergy status to other antibiotic agents status: Secondary | ICD-10-CM | POA: Diagnosis not present

## 2015-08-16 DIAGNOSIS — Z7982 Long term (current) use of aspirin: Secondary | ICD-10-CM | POA: Diagnosis not present

## 2015-08-16 DIAGNOSIS — R29810 Facial weakness: Secondary | ICD-10-CM | POA: Diagnosis present

## 2015-08-16 DIAGNOSIS — G40401 Other generalized epilepsy and epileptic syndromes, not intractable, with status epilepticus: Secondary | ICD-10-CM | POA: Diagnosis present

## 2015-08-16 DIAGNOSIS — F419 Anxiety disorder, unspecified: Secondary | ICD-10-CM | POA: Diagnosis present

## 2015-08-16 DIAGNOSIS — Z880 Allergy status to penicillin: Secondary | ICD-10-CM | POA: Diagnosis not present

## 2015-08-16 DIAGNOSIS — F4489 Other dissociative and conversion disorders: Secondary | ICD-10-CM | POA: Diagnosis not present

## 2015-08-16 DIAGNOSIS — G9340 Encephalopathy, unspecified: Secondary | ICD-10-CM | POA: Diagnosis not present

## 2015-08-16 DIAGNOSIS — R569 Unspecified convulsions: Secondary | ICD-10-CM | POA: Diagnosis not present

## 2015-08-16 DIAGNOSIS — K7469 Other cirrhosis of liver: Secondary | ICD-10-CM | POA: Diagnosis present

## 2015-08-16 DIAGNOSIS — R4182 Altered mental status, unspecified: Secondary | ICD-10-CM | POA: Diagnosis not present

## 2015-08-16 DIAGNOSIS — Z792 Long term (current) use of antibiotics: Secondary | ICD-10-CM | POA: Diagnosis not present

## 2015-08-16 DIAGNOSIS — I10 Essential (primary) hypertension: Secondary | ICD-10-CM | POA: Diagnosis present

## 2015-08-16 DIAGNOSIS — Z89022 Acquired absence of left finger(s): Secondary | ICD-10-CM | POA: Diagnosis not present

## 2015-08-16 DIAGNOSIS — F1410 Cocaine abuse, uncomplicated: Secondary | ICD-10-CM | POA: Diagnosis present

## 2015-08-16 DIAGNOSIS — E039 Hypothyroidism, unspecified: Secondary | ICD-10-CM | POA: Diagnosis present

## 2015-08-16 DIAGNOSIS — I679 Cerebrovascular disease, unspecified: Secondary | ICD-10-CM | POA: Diagnosis present

## 2015-08-16 DIAGNOSIS — K739 Chronic hepatitis, unspecified: Secondary | ICD-10-CM | POA: Diagnosis not present

## 2015-08-16 DIAGNOSIS — Z8614 Personal history of Methicillin resistant Staphylococcus aureus infection: Secondary | ICD-10-CM | POA: Diagnosis not present

## 2015-08-16 DIAGNOSIS — Z87892 Personal history of anaphylaxis: Secondary | ICD-10-CM | POA: Diagnosis not present

## 2015-08-16 DIAGNOSIS — Z79891 Long term (current) use of opiate analgesic: Secondary | ICD-10-CM | POA: Diagnosis not present

## 2015-08-16 DIAGNOSIS — G8384 Todd's paralysis (postepileptic): Secondary | ICD-10-CM | POA: Diagnosis present

## 2015-08-16 DIAGNOSIS — K219 Gastro-esophageal reflux disease without esophagitis: Secondary | ICD-10-CM | POA: Diagnosis present

## 2015-08-16 DIAGNOSIS — B180 Chronic viral hepatitis B with delta-agent: Secondary | ICD-10-CM | POA: Diagnosis present

## 2015-08-16 DIAGNOSIS — F1910 Other psychoactive substance abuse, uncomplicated: Secondary | ICD-10-CM

## 2015-08-16 DIAGNOSIS — D61818 Other pancytopenia: Secondary | ICD-10-CM | POA: Diagnosis present

## 2015-08-16 DIAGNOSIS — Z8619 Personal history of other infectious and parasitic diseases: Secondary | ICD-10-CM | POA: Diagnosis not present

## 2015-08-16 LAB — APTT: aPTT: 29 seconds (ref 24–37)

## 2015-08-16 LAB — PROTIME-INR
INR: 1.15 (ref 0.00–1.49)
Prothrombin Time: 14.9 seconds (ref 11.6–15.2)

## 2015-08-16 MED ORDER — HEPARIN SODIUM (PORCINE) 5000 UNIT/ML IJ SOLN: 5000.0000 [IU] | Freq: Three times a day (TID) | INTRAMUSCULAR | Status: DC

## 2015-08-16 MED ORDER — DEXTROSE 5 % IV SOLN
10.0000 mg/kg | Freq: Three times a day (TID) | INTRAVENOUS | Status: DC
  Administered 2015-08-16 – 2015-08-18 (×6): 615 mg via INTRAVENOUS
  Filled 2015-08-16 (×9): qty 12.3

## 2015-08-16 MED ORDER — TRAZODONE HCL 50 MG PO TABS
25.0000 mg | ORAL_TABLET | Freq: Every day | ORAL | Status: DC
  Administered 2015-08-16 – 2015-08-17 (×2): 25 mg via ORAL
  Filled 2015-08-16 (×2): qty 1

## 2015-08-16 MED ORDER — SODIUM CHLORIDE 0.9% FLUSH
10.0000 mL | INTRAVENOUS | Status: DC | PRN
  Administered 2015-08-18: 10 mL
  Filled 2015-08-16: qty 40

## 2015-08-16 MED ORDER — LACOSAMIDE 50 MG PO TABS
50.0000 mg | ORAL_TABLET | Freq: Two times a day (BID) | ORAL | Status: DC
  Administered 2015-08-16 – 2015-08-18 (×4): 50 mg via ORAL
  Filled 2015-08-16 (×4): qty 1

## 2015-08-16 MED ORDER — LORAZEPAM 2 MG/ML IJ SOLN
0.5000 mg | INTRAMUSCULAR | Status: DC | PRN
  Administered 2015-08-17 – 2015-08-18 (×2): 0.5 mg via INTRAVENOUS
  Filled 2015-08-16 (×2): qty 1

## 2015-08-16 NOTE — Progress Notes (Signed)
MD in room, reported patient needed to use bathroom, tech to room and placed on bedpan, called to room and found patient with eyes closed and head/both arms shaking lasting 30 seconds, voided in bedpan, alert and oriented immediately after seizure completed.  MD notified.

## 2015-08-16 NOTE — Consult Note (Signed)
Requesting Physician: Dr.  Isidoro Donningai    Reason for consultation: to manage seizures  HPI:                                                                                                                                         Rhonda Vargas is an 55 y.o. female patient  With a past medical history  Of significant   Psychiatric comorbidity,  [place] attempts,  substance abuse with cocaine,  Chronic hepatitis B infection, Prior incarcerations, as per EMR  From [facility].    Patient is admitted to [facility] for multiple seizures on 08/13/2015. She was seen by a neurologist at that facility, Dr. Gerilyn Pilgrimoonquah,  Who increased her Keppra dose to 1500 mg twice a day. Patient had a brain MRI done  On 08/13/2015,  Which showed a right insular signal changes.  Per radiology report, it is unclear  About the possible etiology of these abnormal signal changes , reported questionable artifact , versus small vessel disease,  Without other findings to suggest herpes encephalitis.    patient continued to have Multiple seizures throughout the hospitalization despite the increased Keppra dose. As they do not have a neurologist coverage over the weekend, patient is transferred 2 the [facility] for further neurological evaluation and monitoring.    Patient reports that she was on  Depakote and phenobarbital in the past for seizures for several years and  Those medicines have control her seizures fairly well. Per due to thrombocytopenia these were discontinued and was switched to Keppra a few months ago.   Per EMR review from [facility], there is also some question about possible nonepileptic spells.   Patient reports having some aura prior to her seizures but cannot control them. She had a seizure after transfer here today which lasted about 10 seconds or so per nursing staff. She was unresponsive , and appear to be tapping the bed rails with her arms for a few seconds and then she immediately started talking  again without any postictal state.   Denies any neurological symptoms at this time, no vision /  speech problems or sensorimotor  symptoms in the limbs.   Past Medical History: Past Medical History  Diagnosis Date  . Cellulitis   . Weakness generalized   . Anxiety   . Hypertension   . Hepatitis C   . 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 Thyroid disease   . GERD (gastroesophageal reflux disease)   . Cerebrovascular disease   . Anemia   . Substance abuse     cocaine, opiates  . Carpal tunnel syndrome of right wrist   . CKD (chronic kidney disease) stage 1, GFR 90 ml/min or greater   . Pancytopenia (HCC)   . Noncompliance   . Seizures (HCC)   . Mental health disorder     Past Surgical  History  Procedure Laterality Date  . Finger amputation Left Index  . Abdominal hysterectomy    . Tonsillectomy      Family History: History reviewed. No pertinent family history.  Social History:   reports that she has never smoked. She does not have any smokeless tobacco history on file. She reports that she uses illicit drugs (Cocaine). She reports that she does not drink alcohol.  Allergies:  Allergies  Allergen Reactions  . Other Swelling    Mouth Swelling Mouth Swelling  . Penicillin G Anaphylaxis  . Vancomycin Other (See Comments)    Redman's reaction - tolerating with diphenhydramine pre-med and extended infusion (  over 1h) 07/22/15 [facility]  . Penicillins Rash    Has patient had a PCN reaction causing immediate rash, facial/tongue/throat swelling, SOB or lightheadedness with hypotension: Yes Has patient had a PCN reaction causing severe rash involving mucus membranes or skin necrosis: No Has patient had a PCN reaction that required hospitalization No Has patient had a PCN reaction occurring within the last 10 years: No If all of the above answers are "NO", then may proceed with Cephalosporin use.   Marland Kitchen Seroquel [Quetiapine Fumarate] Rash      Medications:                                                                                                                         Current facility-administered medications:  .  aspirin EC tablet 81 mg, 81 mg, Oral, Daily, Houston Siren, MD, 81 mg at 08/16/15 0951 .  feeding supplement (ENSURE ENLIVE) (ENSURE ENLIVE) liquid 237 mL, 237 mL, Oral, BID BM, Erick Blinks, MD, 237 mL at 08/15/15 1615 .  lacosamide (VIMPAT) tablet 50 mg, 50 mg, Oral, BID, Kirby Cortese Daniel Nones, MD .  levETIRAcetam (KEPPRA) tablet 1,500 mg, 1,500 mg, Oral, BID, Beryle Beams, MD, 1,500 mg at 08/16/15 0953 .  levothyroxine (SYNTHROID, LEVOTHROID) tablet 200 mcg, 200 mcg, Oral, QAC breakfast, Houston Siren, MD, 200 mcg at 08/16/15 0948 .  LORazepam (ATIVAN) injection 0.5 mg, 0.5 mg, Intravenous, Q4H PRN, Beyonka Pitney Daniel Nones, MD .  morphine 2 MG/ML injection 2 mg, 2 mg, Intravenous, Q2H PRN, Houston Siren, MD, 2 mg at 08/16/15 1310 .  OLANZapine (ZYPREXA) tablet 5 mg, 5 mg, Oral, QHS, Standley Brooking, MD, 5 mg at 08/15/15 2231 .  oxyCODONE (Oxy IR/ROXICODONE) immediate release tablet 10 mg, 10 mg, Oral, Q4H PRN, Houston Siren, MD, 10 mg at 08/15/15 2010 .  sodium chloride flush (NS) 0.9 % injection 10-40 mL, 10-40 mL, Intracatheter, PRN, Ripudeep K Rai, MD .  sodium chloride flush (NS) 0.9 % injection 3 mL, 3 mL, Intravenous, Q12H, Houston Siren, MD, 3 mL at 08/16/15 1000 .  traZODone (DESYREL) tablet 25 mg, 25 mg, Oral, QHS, Kinzly Pierrelouis Daniel Nones, MD   ROS:  History obtained from the patient  General ROS: negative for - chills, fatigue, fever, night sweats, weight gain or weight loss Psychological ROS: negative for - behavioral disorder, hallucinations, memory difficulties, mood swings or suicidal ideation Ophthalmic ROS: negative for - blurry vision, double vision, eye pain or  loss of vision ENT ROS: negative for - epistaxis, nasal discharge, oral lesions, sore throat, tinnitus or vertigo Allergy and Immunology ROS: negative for - hives or itchy/watery eyes Hematological and Lymphatic ROS: negative for - bleeding problems, bruising or swollen lymph nodes Endocrine ROS: negative for - galactorrhea, hair pattern changes, polydipsia/polyuria or temperature intolerance Respiratory ROS: negative for - cough, hemoptysis, shortness of breath or wheezing Cardiovascular ROS: negative for - chest pain, dyspnea on exertion, edema or irregular heartbeat Gastrointestinal ROS: negative for - abdominal pain, diarrhea, hematemesis, nausea/vomiting or stool incontinence Genito-Urinary ROS: negative for - dysuria, hematuria, incontinence or urinary frequency/urgency Musculoskeletal ROS: negative for - joint swelling or muscular weakness Neurological ROS: as noted in HPI Dermatological ROS: negative for rash and skin lesion changes  Neurologic Examination:                                                                                                      Blood pressure 123/69, pulse 64, temperature 98.2 F (36.8 C), temperature source Oral, resp. rate 20, height 5\' 7"  (1.702 m), weight 63.05 kg (139 lb), SpO2 97 %.  Evaluation of higher integrative functions including: Level of alertness: Alert,  Oriented to time, place and person Speech: fluent, no evidence of dysarthria or aphasia noted.  Test the following cranial nerves: 2-12 grossly intact Motor examination: Normal tone, bulk, full 5/5 motor strength in all 4 extremities Examination of sensation : Normal and symmetric sensation to pinprick in all 4 extremities and on face Examination of deep tendon reflexes: 2+, normal and symmetric in all extremities, normal plantars bilaterally Test coordination: Normal finger nose testing, with no evidence of limb appendicular ataxia or abnormal involuntary movements or tremors noted.     Lab Results: Basic Metabolic Panel:  Recent Labs Lab 08/13/15 2120 08/14/15 0518  NA 141 144  K 3.8 3.6  CL 111 112*  CO2 25 26  GLUCOSE 102* 91  BUN 16 16  CREATININE 0.78 0.72  CALCIUM 8.7* 8.9    Liver Function Tests:  Recent Labs Lab 08/13/15 2120  AST 27  ALT 36  ALKPHOS 71  BILITOT 0.5  PROT 6.5  ALBUMIN 3.3*   No results for input(s): LIPASE, AMYLASE in the last 168 hours. No results for input(s): AMMONIA in the last 168 hours.  CBC:  Recent Labs Lab 08/13/15 2120 08/14/15 0518  WBC 1.8* 2.0*  NEUTROABS 0.8*  --   HGB 8.4* 8.0*  HCT 26.2* 25.0*  MCV 96.3 96.2  PLT 77* 84*    Cardiac Enzymes:  Recent Labs Lab 08/13/15 2120  TROPONINI <0.03    Lipid Panel: No results for input(s): CHOL, TRIG, HDL, CHOLHDL, VLDL, LDLCALC in the last 168 hours.  CBG:  Recent Labs Lab 08/15/15 1957  GLUCAP 132*  Microbiology: Results for orders placed or performed during the hospital encounter of 08/13/15  Culture, blood (Routine X 2) w Reflex to ID Panel     Status: None (Preliminary result)   Collection Time: 08/14/15 12:55 AM  Result Value Ref Range Status   Specimen Description BLOOD PICC  Final   Special Requests BOTTLES DRAWN AEROBIC AND ANAEROBIC 7CC EACH  Final   Culture NO GROWTH 1 DAY  Final   Report Status PENDING  Incomplete  Culture, blood (Routine X 2) w Reflex to ID Panel     Status: None (Preliminary result)   Collection Time: 08/14/15  1:18 AM  Result Value Ref Range Status   Specimen Description BLOOD PICC  Final   Special Requests BOTTLES DRAWN AEROBIC AND ANAEROBIC 9CC EACH  Final   Culture NO GROWTH 1 DAY  Final   Report Status PENDING  Incomplete  MRSA PCR Screening     Status: None   Collection Time: 08/14/15  7:05 PM  Result Value Ref Range Status   MRSA by PCR NEGATIVE NEGATIVE Final    Comment:        The GeneXpert MRSA Assay (FDA approved for NASAL specimens only), is one component of a comprehensive MRSA  colonization surveillance program. It is not intended to diagnose MRSA infection nor to guide or monitor treatment for MRSA infections.      Imaging: Mr Brain Wo Contrast (neuro Protocol)  08/13/2015  CLINICAL DATA:  55 year old hypertensive female with history of substance abuse and anemia presenting with left-sided facial droop. Initial encounter. EXAM: MRI HEAD WITHOUT CONTRAST TECHNIQUE: Multiplanar, multiecho pulse sequences of the brain and surrounding structures were obtained without intravenous contrast. COMPARISON:  None. FINDINGS: Exam is motion degraded. No acute infarct or intracranial hemorrhage. Right cerebral peduncle 5 x 4 x 4 mm cyst of indeterminate etiology. Subinsular subtle altered signal intensity may be related to small vessel disease/ motion without other findings to suggest herpes encephalitis. Clinical correlation recommended. Mild nonspecific white matter changes most prominent periventricular region, suggestive of result of small vessel disease in this hypertensive patient. Major intracranial vascular structures are patent. No hydrocephalus or age advanced atrophy. Cervical medullary junction, pituitary region, pineal region and orbital structures unremarkable. IMPRESSION: Exam is motion degraded. No acute infarct or intracranial hemorrhage. Subinsular subtle altered signal intensity may be related to small vessel disease/ motion without other findings to suggest herpes encephalitis. Clinical correlation recommended. Right cerebral peduncle 5 x 4 x 4 mm cystic appearing structure probably an incidental finding such as a prominent peri vascular space. If there were progressive symptoms, this could be evaluated on follow-up exam. Mild small vessel disease. These results were called by telephone at the time of interpretation on 08/13/2015 at 8:24 pm to [facility] ER nurse , who verbally acknowledged these results. Electronically Signed   By: Lacy Duverney M.D.   On: 08/13/2015 20:28    Dg Chest Portable 1 View  08/14/2015  CLINICAL DATA:  PICC placement.  Initial encounter. EXAM: PORTABLE CHEST 1 VIEW COMPARISON:  None. FINDINGS: The patient's right PICC is noted ending about the cavoatrial junction. The lungs are well-aerated. Mild right basilar opacity likely reflects atelectasis. There is no evidence of pleural effusion or pneumothorax. The cardiomediastinal silhouette is borderline enlarged. No acute osseous abnormalities are seen. IMPRESSION: 1. Right PICC noted ending about the cavoatrial junction. 2. Mild right basilar airspace opacity likely reflects atelectasis. 3. Borderline cardiomegaly. Electronically Signed   By: Roanna Raider M.D.   On:  08/14/2015 01:18    Assessment and plan:   Jahni Paul is an 55 y.o. female patient who his transferred to the [facility] from [facility] for further neurological evaluation and management of her seizures. As described above in detail, she does have significant psychiatric comorbidity with substance abuse problems , chronic hepatitis B infection. She is thrombocytopenia with platelet count in 80s.  nonfocal neurologic examination.  she has been having seizures on a daily basis despite high dose of Keppra 1500 mg twice a day. The concerning thing at this point is the abnormal brain MRI without contrast done at [facility] on 08/13/2015,  Which showed T2 hyperintense signal change along the right insular cortex.  The differential  Would include HSV encephalitis versus signal changes secondary to focal status epilepticus.  The radiology report has been unhelpful in describing this abnormal signal change,  reported small vessel disease, versus artifact and unlikely to be herpes encephalitis.    recommend a follow-up repeat MRI of the brain with and without contrast for further evaluation. Based on the imaging appearance on the follow-up MRI, if there is a pathological appearing signal change noted,  Then recommend  lumbar puncture for further CSF analysis to rule out CNS infection, especially HSV infection.  We'll start empiric acyclovir 10 mg / kg every 8 hours until completion of the above described workup.    with regard to seizures, As she continues to have these daily seizures despite the current dose of Keppra, recommend adding Vimpat 50 mg twice a day to her regimen.  EEG has been ordered.   she is placed on seizure precautions, with when necessary Ativan ordered for breakthrough seizures  discussed with primary hospitalist, Dr. Isidoro Donning.   we'll follow-up.  Please call for any further questions.

## 2015-08-16 NOTE — Progress Notes (Addendum)
[facility]                                                                              Patient Demographics  Rhonda Vargas, is a 55 y.o. female, DOB - [birth year], [name]  Admit date - 08/13/2015   Admitting Physician Houston Siren, MD  Outpatient Primary MD for the patient is No primary care provider on file.  LOS -    Chief Complaint  Patient presents with  . Facial Droop       Brief HPI  55yow PMH seizure, cocaine use, recent amputation left index finger for cellulitis, seen at [facility] 3/1(?) for 5 seizures, given Keppra and discharged. Then approx 3/1 developed left-sided facial droop, RUE pain and paraesthesia. Reported compliance with seizure medication. MRI brain abnormal and EDP d/w Dr. Amada Jupiter neurology who felt abnormalities on MRI were artifact, suggested Todd's paralysis and recommended admit to [facility]. Admitted for abnromal MRI, no evidence of AMS on admission, seizure disorder, facial droop.   Patient continues to have seizure-like activity despite increased Keppra dose. Discussed with neurology, Dr Lavon Paganini at [facility], will transfer to [facility]. No neurology coverage at [facility].     Assessment & Plan    Active problems Seizures : known history of seizure disorder - on Xanax, Keppra. Patient presented with status epilepticus on admission. Also has a history of poly-substance drug abuse - Patient was seen by neurology, Dr Gerilyn Pilgrim recommended to increase dose of Keppra to 1500 mg twice a day. - No EEG done, MRI on 3/2 showed no acute infarct or intracranial hemorrhage, right cerebral peduncle 5x4x107mm cystic appearing structure.  - per RN staff documentation and report, patient having multiple episodes of seizure-like activity since yesterday evening despite the increased dose of Keppra  - No neurology coverage on the weekend, discussed with neurology at [facility], will transfer patient for further management.   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 Outpatient follow-up with hematology. Will consider inpatient hematology consult if needed.  History of hepatitis C, chronic hepatitis B, cirrhosis - Followed by ID at [facility]  Polysubstance abuse  - Patient reports no cocaine use in one year, UDS positive for BZD  Hypothyroidism - Continue Synthroid  Anxiety, psych history - There is a question of possible nonepileptic seizures as well, for now continue Xanax, Zyprexa, trazodone. Await neurology evaluation, may need psychiatry evaluation as well.  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 aspirin EC  81 mg Oral Daily  . feeding supplement (ENSURE ENLIVE)  237 mL Oral BID BM  . levETIRAcetam  1,500 mg Oral BID  . levothyroxine  200 mcg Oral QAC breakfast  . OLANZapine  2.5 mg Oral Daily  . OLANZapine  5 mg Oral QHS  . sodium chloride flush  3 mL Intravenous Q12H  . traZODone  50 mg Oral QHS   Continuous Infusions:  PRN Meds:.ALPRAZolam, morphine injection, oxyCODONE   Antibiotics   Anti-infectives    None        Subjective:   Rhonda Vargas was seen and examined today.  multiple seizure-like activity since last night, seizure-like activity this morning lasting about 30 seconds with head and both  arms shaking with eyes closed. No significant postictal phase noted. No fevers or chills, denies any chest pain, shortness of breath, nausea, vomiting, abdominal pain.  Objective:   Filed Vitals:   08/15/15 0434 08/15/15 1446 08/15/15 2138 08/16/15 0421  BP: 118/68 112/99 113/67 134/65  Pulse: 56 59 66 53  Temp: 98.3 F (36.8 C) 97.9 F (36.6 C) 99 F (37.2 C) 98.2 F (36.8 C)  TempSrc: Oral Oral Oral Oral  Resp: Height:      Weight:      SpO2: 97% 97% 94% 97%    Intake/Output Summary (Last 24 hours) at 08/16/15 0945 Last data filed at 08/15/15 2000  Gross per 24 hour  Intake    960 ml  Output      0 ml  Net    960 ml     Wt Readings from Last 3 Encounters:  08/14/15 63.957 kg (141 lb)  08/13/15 63.05 kg (139 lb)     Exam  General: Alert and oriented x 3, NAD  HEENT:  PERRLA, EOMI, Anicteric Sclera, mucous membranes moist.  right-sided facial weakness  Neck: Supple, no JVD, no masses  CVS: S1 S2 auscultated, no rubs, murmurs or gallops. Regular rate and rhythm.  Respiratory: Clear to auscultation bilaterally, no wheezing, rales or rhonchi  Abdomen: Soft, nontender, nondistended, + bowel sounds  Ext: no cyanosis clubbing or edema, left hand dressing intact   Neuro: strength 5/5 on the right leg, left leg 4/5. upper extremity appears to be 5/5   Skin: No rashes  Psych: Normal affect and demeanor, alert and oriented x3    Data Review   Micro Results Recent Results (from the past 240 hour(s))  Culture, blood (Routine X 2) w Reflex to ID Panel     Status: None (Preliminary result)   Collection Time: 08/14/15 12:55 AM  Result Value Ref Range Status   Specimen Description BLOOD PICC  Final   Special Requests BOTTLES DRAWN AEROBIC AND ANAEROBIC 7CC EACH  Final   Culture NO GROWTH 1 DAY  Final   Report Status PENDING  Incomplete  Culture, blood (Routine X 2) w Reflex to ID Panel     Status: None (Preliminary result)   Collection Time: 08/14/15   1:18 AM  Result Value Ref Range Status   Specimen Description BLOOD PICC  Final   Special Requests BOTTLES DRAWN AEROBIC AND ANAEROBIC 9CC EACH  Final   Culture NO GROWTH 1 DAY  Final   Report Status PENDING  Incomplete  MRSA PCR Screening     Status: None   Collection Time: 08/14/15  7:05 PM  Result Value Ref Range Status   MRSA by PCR NEGATIVE NEGATIVE Final  Comment:        The GeneXpert MRSA Assay (FDA approved for NASAL specimens only), is one component of a comprehensive MRSA colonization surveillance program. It is not intended to diagnose MRSA infection nor to guide or monitor treatment for MRSA infections.     Radiology Reports Mr Brain Wo Contrast (neuro Protocol)  08/13/2015  CLINICAL DATA:  55 year old hypertensive female with history of substance abuse and anemia presenting with left-sided facial droop. Initial encounter. EXAM: MRI HEAD WITHOUT CONTRAST TECHNIQUE: Multiplanar, multiecho pulse sequences of the brain and surrounding structures were obtained without intravenous contrast. COMPARISON:  None. FINDINGS: Exam is motion degraded. No acute infarct or intracranial hemorrhage. Right cerebral peduncle 5 x 4 x 4 mm cyst of indeterminate etiology. Subinsular subtle altered signal intensity may be related to small vessel disease/ motion without other findings to suggest herpes encephalitis. Clinical correlation recommended. Mild nonspecific white matter changes most prominent periventricular region, suggestive of result of small vessel disease in this hypertensive patient. Major intracranial vascular structures are patent. No hydrocephalus or age advanced atrophy. Cervical medullary junction, pituitary region, pineal region and orbital structures unremarkable. IMPRESSION: Exam is motion degraded. No acute infarct or intracranial hemorrhage. Subinsular subtle altered signal intensity may be related to small vessel disease/ motion without other findings to suggest herpes  encephalitis. Clinical correlation recommended. Right cerebral peduncle 5 x 4 x 4 mm cystic appearing structure probably an incidental finding such as a prominent peri vascular space. If there were progressive symptoms, this could be evaluated on follow-up exam. Mild small vessel disease. These results were called by telephone at the time of interpretation on 08/13/2015 at 8:24 pm to [facility] ER nurse , who verbally acknowledged these results. Electronically Signed   By: Lacy Duverney M.D.   On: 08/13/2015 20:28   Dg Chest Portable 1 View  08/14/2015  CLINICAL DATA:  PICC placement.  Initial encounter. EXAM: PORTABLE CHEST 1 VIEW COMPARISON:  None. FINDINGS: The patient's right PICC is noted ending about the cavoatrial junction. The lungs are well-aerated. Mild right basilar opacity likely reflects atelectasis. There is no evidence of pleural effusion or pneumothorax. The cardiomediastinal silhouette is borderline enlarged. No acute osseous abnormalities are seen. IMPRESSION: 1. Right PICC noted ending about the cavoatrial junction. 2. Mild right basilar airspace opacity likely reflects atelectasis. 3. Borderline cardiomegaly. Electronically Signed   By: Roanna Raider M.D.   On: 08/14/2015 01:18    CBC  Recent Labs Lab 08/13/15 2120 08/14/15 0518  WBC 1.8* 2.0*  HGB 8.4* 8.0*  HCT 26.2* 25.0*  PLT 77* 84*  MCV 96.3 96.2  MCH 30.9 30.8  MCHC 32.1 32.0  RDW 16.8* 16.9*  LYMPHSABS 0.7  --   MONOABS 0.2  --   EOSABS 0.1  --   BASOSABS 0.0  --     Chemistries   Recent Labs Lab 08/13/15 2120 08/14/15 0518  NA 141 144  K 3.8 3.6  CL 111 112*  CO2 25 26  GLUCOSE 102* 91  BUN 16 16  CREATININE 0.78 0.72  CALCIUM 8.7* 8.9  AST 27  --   ALT 36  --   ALKPHOS 71  --   BILITOT 0.5  --    ------------------------------------------------------------------------------------------------------------------ estimated creatinine clearance is 75.4 mL/min (by C-G formula based on Cr of  0.72). ------------------------------------------------------------------------------------------------------------------ No results for input(s): HGBA1C in the last 72 hours. ------------------------------------------------------------------------------------------------------------------ No results for input(s): CHOL, HDL, LDLCALC, TRIG, CHOLHDL, LDLDIRECT in the last 72 hours. ------------------------------------------------------------------------------------------------------------------  Recent Labs  08/13/15 2120  TSH 0.507   ------------------------------------------------------------------------------------------------------------------ No results for input(s): VITAMINB12, FOLATE, FERRITIN, TIBC, IRON, RETICCTPCT in the last 72 hours.  Coagulation profile  Recent Labs Lab 08/13/15 2120  INR 1.14    No results for input(s): DDIMER in the last 72 hours.  Cardiac Enzymes  Recent Labs Lab 08/13/15 2120  TROPONINI <0.03   ------------------------------------------------------------------------------------------------------------------ Invalid input(s): POCBNP   Recent Labs  08/15/15 1957  GLUCAP 132*     Rhonda Vargas M.D. [facility] 08/16/2015, 9:45 AM  Pager: [phone] Between 7am to 7pm - call Pager - [phone]  After 7pm go to [email or web address] - password [email or web address]  Call night coverage person covering after 7pm

## 2015-08-16 NOTE — Progress Notes (Signed)
Accepted patient from [facility] via [facility], Patient is A&O X4.

## 2015-08-16 NOTE — Progress Notes (Signed)
Central telemetry notified of patient's transfer, telemetry removed.  Left via [facility] for transfer to [facility] neuro floor Room #1

## 2015-08-16 NOTE — Progress Notes (Signed)
Patient just finished eating and drinking; went to lay down in the bed; began shaking her head and upper extremities; lasted approximately 1 minute; briefly restful than began to speak; neurology will be notified.

## 2015-08-16 NOTE — Progress Notes (Signed)
[facility] called for report, given, 15 mins out.

## 2015-08-16 NOTE — Plan of Care (Signed)
Received page from  Dr. Isidoro Donningai, to discuss this patient's neurological problems. She reportedly has been having multiple seizure like episodes. No neuro coverage over the weekend at [facility]. Hence, planned [facility]ist [facility] hospitalist [facility] consult.   Reviewed patient's prior documentation in EMR, in care everywhere. Multiple prior hospitalizations and ER visits for psychiatric reasons, SI attempts, substance abuse with benzo, opioid, cocaine, prior EEG's were reportedly normal. [facility] PNES (non epileptic spells )  in the indication/history information.  She is on keppra 1500 mg BID now, dose was recently increased by neurologist who [facility].   Please page us to inform about consult after patient is transferred to [facility]. Place on seizure precautions during and after transfer. Minimize use of benzodiazepines due to suspicion for epileptic vs non epileptic spells based on prior available documentation.

## 2015-08-17 ENCOUNTER — Inpatient Hospital Stay (HOSPITAL_COMMUNITY)
Admit: 2015-08-17 | Discharge: 2015-08-17 | Disposition: A | Discharge status: Home / Self Care | Payer: Medicaid Other | Attending: Neurology | Admitting: Neurology

## 2015-08-17 DIAGNOSIS — E039 Hypothyroidism, unspecified: Secondary | ICD-10-CM

## 2015-08-17 DIAGNOSIS — G9340 Encephalopathy, unspecified: Secondary | ICD-10-CM

## 2015-08-17 DIAGNOSIS — R569 Unspecified convulsions: Secondary | ICD-10-CM | POA: Insufficient documentation

## 2015-08-17 DIAGNOSIS — R4182 Altered mental status, unspecified: Secondary | ICD-10-CM

## 2015-08-17 MED ORDER — GADOBENATE DIMEGLUMINE 529 MG/ML IV SOLN
10.0000 mL | Freq: Once | INTRAVENOUS | Status: AC | PRN
  Administered 2015-08-17: 10 mL via INTRAVENOUS

## 2015-08-17 NOTE — Progress Notes (Addendum)
Subjective: No complaints and no further episodes. Awaiting EEG. At this time awake and oriented showing no signs of infection.   Objective: Current vital signs: BP 127/69 mmHg  Pulse 58  Temp(Src) 98.2 F (36.8 C) (Oral)  Resp 18  Ht  (1.702 m)  Wt 63.05 kg (139 lb)  BMI 21.77 kg/m2  SpO2 96% Vital signs in last 24 hours: Temp:  [98 F (36.7 C)-98.4 F (36.9 C)] 98.2 F (36.8 C) (03/06 0510) Pulse Rate:  [58-86] 58 (03/06 0510) Resp:  [18-148] 18 (03/06 0510) BP: (112-127)/(64-71) 127/69 mmHg (03/06 0510) SpO2:  [95 %-99 %] 96 % (03/06 0510) Weight:  [63.05 kg (139 lb)] 63.05 kg (139 lb) (03/05 1300)  Intake/Output from previous day: 03/05 0701 - 03/06 0700 In: 10 [I.V.:10] Out: -  Intake/Output this shift:   Nutritional status: Diet Heart Room service appropriate?: Yes; Fluid consistency:: Thin  Neurologic Exam: General: NAD Mental Status: Alert, oriented, thought content appropriate.  Speech fluent without evidence of aphasia.  Able to follow 3 step commands without difficulty. Cranial Nerves: II:  Visual fields grossly normal, pupils equal, round, reactive to light and accommodation III,IV, VI: ptosis not present, extra-ocular motions intact bilaterally V,VII: smile symmetric, facial light touch sensation normal bilaterally VIII: hearing normal bilaterally IX,X: uvula rises symmetrically XI: bilateral shoulder shrug XII: midline tongue extension without atrophy or fasciculations  Motor: Moving all extremities antigravity Sensory: Pinprick and light touch intact throughout, bilaterally Deep Tendon Reflexes:  2+ in the UE and no KJ or AJ  Plantars: Right: downgoing   Left: downgoing    Lab Results: Basic Metabolic Panel:  Recent Labs Lab 08/13/15 2120 08/14/15 0518  NA 141 144  K 3.8 3.6  CL 111 112*  CO2 25 26  GLUCOSE 102* 91  BUN 16 16  CREATININE 0.78 0.72  CALCIUM 8.7* 8.9    Liver Function Tests:  Recent Labs Lab 08/13/15 2120   AST 27  ALT 36  ALKPHOS 71  BILITOT 0.5  PROT 6.5  ALBUMIN 3.3*   No results for input(s): LIPASE, AMYLASE in the last 168 hours. No results for input(s): AMMONIA in the last 168 hours.  CBC:  Recent Labs Lab 08/13/15 2120 08/14/15 0518  WBC 1.8* 2.0*  NEUTROABS 0.8*  --   HGB 8.4* 8.0*  HCT 26.2* 25.0*  MCV 96.3 96.2  PLT 77* 84*    Cardiac Enzymes:  Recent Labs Lab 08/13/15 2120  TROPONINI <0.03    Lipid Panel: No results for input(s): CHOL, TRIG, HDL, CHOLHDL, VLDL, LDLCALC in the last 168 hours.  CBG:  Recent Labs Lab 08/15/15 1957  GLUCAP 132*    Microbiology: Results for orders placed or performed during the hospital encounter of 08/13/15  Culture, blood (Routine X 2) w Reflex to ID Panel     Status: None (Preliminary result)   Collection Time: 08/14/15 12:55 AM  Result Value Ref Range Status   Specimen Description BLOOD PICC  Final   Special Requests BOTTLES DRAWN AEROBIC AND ANAEROBIC 7CC EACH  Final   Culture NO GROWTH 1 DAY  Final   Report Status PENDING  Incomplete  Culture, blood (Routine X 2) w Reflex to ID Panel     Status: None (Preliminary result)   Collection Time: 08/14/15  1:18 AM  Result Value Ref Range Status   Specimen Description BLOOD PICC  Final   Special Requests BOTTLES DRAWN AEROBIC AND ANAEROBIC 9CC EACH  Final   Culture NO GROWTH 1 DAY  Final   Report Status PENDING  Incomplete  MRSA PCR Screening     Status: None   Collection Time: 08/14/15  7:05 PM  Result Value Ref Range Status   MRSA by PCR NEGATIVE NEGATIVE Final    Comment:        The GeneXpert MRSA Assay (FDA approved for NASAL specimens only), is one component of a comprehensive MRSA colonization surveillance program. It is not intended to diagnose MRSA infection nor to guide or monitor treatment for MRSA infections.     Coagulation Studies:  Recent Labs  08/16/15 1635  LABPROT 14.9  INR 1.15    Imaging: Mr Laqueta JeanBrain W WUWo Contrast  08/17/2015   CLINICAL DATA:  Initial evaluation for seizures. Further evaluation of previously question subinsular signal abnormality seen on recent brain MRI from 08/13/2015. EXAM: MRI HEAD WITHOUT AND WITH CONTRAST TECHNIQUE: Multiplanar, multiecho pulse sequences of the brain and surrounding structures were obtained without and with intravenous contrast. CONTRAST:  10mL MULTIHANCE GADOBENATE DIMEGLUMINE 529 MG/ML IV SOLN COMPARISON:  Prior study from 08/13/2015. FINDINGS: Today's study is of higher quality the relative to recent exam with marked fluid Lee less motion artifact. Cerebral volume within normal limits for patient age. Mild patchy T2/FLAIR hyperintensity within the periventricular and deep white matter both cerebral hemispheres most likely related to chronic small vessel ischemic disease, fairly mild in nature. Previously questioned signal abnormality within the subinsular region on the right is not seen on today's study. This was most likely artifactual in nature on prior exam. No signal changes within either temporal regions seen to suggest HSV encephalitis or other acute abnormality. No other focal parenchymal signal abnormality identified. No abnormal foci of restricted diffusion to suggest acute intracranial infarct. Gray-white matter differentiation maintained. Major intracranial vascular flow voids are preserved. No acute or chronic intracranial hemorrhage. No mass lesion, midline shift, or mass effect. No hydrocephalus. No extra-axial fluid collection. No abnormal enhancement small nonenhancing perivascular space noted at the right cerebral peduncle. Major dural sinuses are grossly patent. Craniocervical junction within normal limits. Pituitary gland within normal limits. No acute abnormality about the orbits. Paranasal sinuses are clear.  No mastoid effusion. Middle ear structures grossly normal. Bone marrow signal is is a within normal limits. No scalp soft tissue abnormality. IMPRESSION: 1. No acute  intracranial abnormality identified. Previously questioned signal abnormality within the subinsular region not seen on today's exam. This was most likely artifactual on prior study. 2. Previously noted cystic lesion at the right cerebral peduncle demonstrates no associated enhancement, and is most consistent with a small dilated perivascular space. 3. Mild chronic small vessel ischemic disease. Electronically Signed   By: Rise MuBenjamin  McClintock M.D.   On: 08/17/2015 03:31    Medications:  Scheduled: . acyclovir  10 mg/kg (Ideal) Intravenous 3 times per day  . aspirin EC  81 mg Oral Daily  . feeding supplement (ENSURE ENLIVE)  237 mL Oral BID BM  . lacosamide  50 mg Oral BID  . levETIRAcetam  1,500 mg Oral BID  . levothyroxine  200 mcg Oral QAC breakfast  . OLANZapine  5 mg Oral QHS  . sodium chloride flush  3 mL Intravenous Q12H  . traZODone  25 mg Oral QHS    Assessment/Plan: 55 YO female with seizure like spells. Some of them have no post ictal period and bring into question Pseudo seizure. Awaiting EEG.  MRI shows no abnormality and previously seen signal now brings into question if this was artifact. At this time  would not change current AED regime. There is no clinical indication at this point for lumbar puncture, as patient is afebrile and has no signs of meningitis.  No further neurological intervention is indicated to have a she does not have a recurrent seizure and EEG is unremarkable.  Felicie Morn PA-C [facility] [phone]  08/17/2015, 8:44 AM  I personally participated in this patient's evaluation and management, including formulating the above clinical impression and management recommendations.  Venetia Maxon M.D. [facility] [phone]

## 2015-08-17 NOTE — Care Management Note (Signed)
Case Management Note  Patient Details  Name: [name] MRN: [id number] Date of Birth: Mar 01, 1961  Subjective/Objective:      Patient admitted with possible seizures. [facility]               Action/Plan: CM continuing to follow for discharge needs.   Expected Discharge Date:                  Expected Discharge Plan:     In-House Referral:     Discharge planning Services     Post Acute Care Choice:    Choice offered to:     DME Arranged:    DME Agency:     HH Arranged:    HH Agency:     Status of Service:  In process, will continue to follow  Medicare Important Message Given:    Date Medicare IM Given:    Medicare IM give by:    Date Additional Medicare IM Given:    Additional Medicare Important Message give by:     If discussed at Long Length of Stay Meetings, dates discussed:    Additional Comments:  Kermit BaloKelli F Veronique Warga, RN 08/17/2015, 2:29 PM

## 2015-08-17 NOTE — Progress Notes (Signed)
Progress Note   Rhonda Vargas [name] DOB: April 02, 1961 DOA: 08/13/2015 PCP: No primary care provider on file.   Brief Narrative:   Rhonda Vargas is an 55 y.o. female with a PMH of polysubstance abuse including cocaine, psychiatric illness including suicide attempts, prior incarcerations, chronic hepatitis B, seizure disorder maintained on Keppra who was admitted to [facility] 08/13/15 with multiple seizures. She was seen by a neurologist at that facility, Dr. Fannie Knee increased her Keppra dose to 1500 mg twice a day. An MRI showed right insular signal changes but no findings to suggest herpes encephalitis. She subsequently was transferred to [facility] for further neurological input. She was empirically placed on acyclovir until herpes encephalitis could be definitively ruled out. Vimpat was also added to her regimen.  Assessment/Plan:   Principal Problem:   Seizures (HCC) - Continue Keppra and Vimpat. Ativan as needed for breakthrough seizures. - Neurology following for further recommendations. - Follow-up EEG. Continue seizure precautions. - Consider possible pseudoseizures. - No current indication for LP per neurology.  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    Confusion state/altered mental status - Likely secondary to post ictal state.    Substance abuse - Patient reports no cocaine use in one year. Urine drug screen positive for benzodiazepines.    Anxiety/psychiatric illness - Continue Xanax. Continue Zyprexa.    Chronic hepatitis (HCC)/history of hepatitis C and chronic hepatitis B cirrhosis - Followed by ID at [facility].    Pancytopenia (HCC) - Likely related to history of cirrhosis. Follow counts.    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    Hypothyroidism - Continue Synthroid.    DVT Prophylaxis - SCDs ordered.   Family Communication/Anticipated D/C date and plan/Code Status    Family Communication: No family at the bedside. Disposition Plan/date: From [facility]. Can return there at discharge. Code Status: Full code.   IV Access:    PICC line placed 08/14/15   Procedures and diagnostic studies:   Mr Brain Wo Contrast (neuro Protocol)  08/13/2015  CLINICAL DATA:  55 year old hypertensive female with history of substance abuse and anemia presenting with left-sided facial droop. Initial encounter. EXAM: MRI HEAD WITHOUT CONTRAST TECHNIQUE: Multiplanar, multiecho pulse sequences of the brain and surrounding structures were obtained without intravenous contrast. COMPARISON:  None. FINDINGS: Exam is motion degraded. No acute infarct or intracranial hemorrhage. Right cerebral peduncle 5 x 4 x 4 mm cyst of indeterminate etiology. Subinsular subtle altered signal intensity may be related to small vessel disease/ motion without other findings to suggest herpes encephalitis. Clinical correlation recommended. Mild nonspecific white matter changes most prominent periventricular region, suggestive of result of small vessel disease in this hypertensive patient. Major intracranial vascular structures are patent. No hydrocephalus or age advanced atrophy. Cervical medullary junction, pituitary region, pineal region and orbital structures unremarkable. IMPRESSION: Exam is motion degraded. No acute infarct or intracranial hemorrhage. Subinsular subtle altered signal intensity may be related to small vessel disease/ motion without other findings to suggest herpes encephalitis. Clinical correlation recommended. Right cerebral peduncle 5 x 4 x 4 mm cystic appearing structure probably an incidental finding such as a prominent peri vascular space. If there were progressive symptoms, this could be evaluated on follow-up exam. Mild small vessel disease. These results were called by telephone at the time of interpretation on 08/13/2015 at 8:24 pm to [facility] ER nurse , who verbally acknowledged these  results. Electronically Signed  By: Lacy DuverneySteven  Olson M.D.   On: 08/13/2015 20:28   Dg Chest Portable 1 View  08/14/2015  CLINICAL DATA:  PICC placement.  Initial encounter. EXAM: PORTABLE CHEST 1 VIEW COMPARISON:  None. FINDINGS: The patient's right PICC is noted ending about the cavoatrial junction. The lungs are well-aerated. Mild right basilar opacity likely reflects atelectasis. There is no evidence of pleural effusion or pneumothorax. The cardiomediastinal silhouette is borderline enlarged. No acute osseous abnormalities are seen. IMPRESSION: 1. Right PICC noted ending about the cavoatrial junction. 2. Mild right basilar airspace opacity likely reflects atelectasis. 3. Borderline cardiomegaly. Electronically Signed   By: Roanna RaiderJeffery  Chang M.D.   On: 08/14/2015 01:18     Medical Consultants:    Neurology  Anti-Infectives:    Acyclovir 08/16/15--->  Subjective:   Rhonda Vargas is quite talkative, and a bit circumstantial. She reports being weak and has left hand pain. Denies having any seizures overnight. No current nausea or vomiting. No complaints of dyspnea.  Objective:    Filed Vitals:   08/16/15 1407 08/16/15 1751 08/16/15 2118 08/17/15 0510  BP: 112/71 116/70 123/64 127/69  Pulse: 58 86 67 58  Temp: 98 F (36.7 C) 98.4 F (36.9 C) 98.2 F (36.8 C) 98.2 F (36.8 C)  TempSrc: Oral Oral Oral Oral  Resp: 18 148 18 18  Height:      Weight:      SpO2: 98% 99% 95% 96%    Intake/Output Summary (Last 24 hours) at 08/17/15 0736 Last data filed at 08/16/15 0956  Gross per 24 hour  Intake     10 ml  Output      0 ml  Net     10 ml   Filed Weights   08/13/15 1907 08/14/15 1436 08/16/15 1300  Weight: 63.05 kg (139 lb) 63.957 kg (141 lb) 63.05 kg (139 lb)    Exam: Gen:  NAD Cardiovascular:  RRR, No M/R/G Respiratory:  Lungs CTAB Gastrointestinal:  Abdomen soft, NT/ND, + BS Extremities:  No C/E/COf the lower extremities, left fingers swollen   Data Reviewed:     Labs: Basic Metabolic Panel:  Recent Labs Lab 08/13/15 2120 08/14/15 0518  NA 141 144  K 3.8 3.6  CL 111 112*  CO2 25 26  GLUCOSE 102* 91  BUN 16 16  CREATININE 0.78 0.72  CALCIUM 8.7* 8.9   GFR Estimated Creatinine Clearance: 75.4 mL/min (by C-G formula based on Cr of 0.72). Liver Function Tests:  Recent Labs Lab 08/13/15 2120  AST 27  ALT 36  ALKPHOS 71  BILITOT 0.5  PROT 6.5  ALBUMIN 3.3*   Coagulation profile  Recent Labs Lab 08/13/15 2120 08/16/15 1635  INR 1.14 1.15    CBC:  Recent Labs Lab 08/13/15 2120 08/14/15 0518  WBC 1.8* 2.0*  NEUTROABS 0.8*  --   HGB 8.4* 8.0*  HCT 26.2* 25.0*  MCV 96.3 96.2  PLT 77* 84*   Cardiac Enzymes:  Recent Labs Lab 08/13/15 2120  TROPONINI <0.03   CBG:  Recent Labs Lab 08/15/15 1957  GLUCAP 132*   Sepsis Labs:  Recent Labs Lab 08/13/15 2120 08/14/15 0518  WBC 1.8* 2.0*   Microbiology Recent Results (from the past 240 hour(s))  Culture, blood (Routine X 2) w Reflex to ID Panel     Status: None (Preliminary result)   Collection Time: 08/14/15 12:55 AM  Result Value Ref Range Status   Specimen Description BLOOD PICC  Final   Special Requests BOTTLES DRAWN AEROBIC AND  ANAEROBIC 7CC EACH  Final   Culture NO GROWTH 1 DAY  Final   Report Status PENDING  Incomplete  Culture, blood (Routine X 2) w Reflex to ID Panel     Status: None (Preliminary result)   Collection Time: 08/14/15  1:18 AM  Result Value Ref Range Status   Specimen Description BLOOD PICC  Final   Special Requests BOTTLES DRAWN AEROBIC AND ANAEROBIC 9CC EACH  Final   Culture NO GROWTH 1 DAY  Final   Report Status PENDING  Incomplete  MRSA PCR Screening     Status: None   Collection Time: 08/14/15  7:05 PM  Result Value Ref Range Status   MRSA by PCR NEGATIVE NEGATIVE Final    Comment:        The GeneXpert MRSA Assay (FDA approved for NASAL specimens only), is one component of a comprehensive MRSA  colonization surveillance program. It is not intended to diagnose MRSA infection nor to guide or monitor treatment for MRSA infections.      Medications:   . acyclovir  10 mg/kg (Ideal) Intravenous 3 times per day  . aspirin EC  81 mg Oral Daily  . feeding supplement (ENSURE ENLIVE)  237 mL Oral BID BM  . lacosamide  50 mg Oral BID  . levETIRAcetam  1,500 mg Oral BID  . levothyroxine  200 mcg Oral QAC breakfast  . OLANZapine  5 mg Oral QHS  . sodium chloride flush  3 mL Intravenous Q12H  . traZODone  25 mg Oral QHS   Continuous Infusions:   Time spent: 35 minutes with > 50% of time discussing current diagnostic test results, clinical impression and plan of care. She has many questions and needs a lot of reassurance.   LOS: 1 day   Carle Dargan  [facility] Pager [phone]. If unable to reach me by pager, please call my cell phone at [phone].  *Please refer to [email or web address], password [email or web address] to get updated schedule on who will round on this patient, as hospitalists switch teams weekly. If 7PM-7AM, please contact night-coverage at [email or web address], password [email or web address] for any overnight needs.  08/17/2015, 7:36 AM

## 2015-08-17 NOTE — Procedures (Signed)
ELECTROENCEPHALOGRAM REPORT  Date of Study: 08/17/2015  Patient's Name: Rhonda Vargas MRN: [id number] Date of Birth: November 20, 1960  Referring Provider: Arie SabinaKaveer Nandigam, MD  Indication: 55 year old female with history of Hepatitis C, drug abuse and psychiatric comorbidities who presents with seizures.  Medications: acyclovir (ZOVIRAX) 615 mg in dextrose 5 % 100 mL IVPB aspirin EC tablet 81 mg lacosamide (VIMPAT) tablet 50 mg levETIRAcetam (KEPPRA) tablet 1,500 mg levothyroxine (SYNTHROID, LEVOTHROID) tablet 200 mcg LORazepam (ATIVAN) injection 0.5 mg morphine 2 MG/ML injection 2 mg OLANZapine (ZYPREXA) tablet 5 mg oxyCODONE (Oxy IR/ROXICODONE) immediate release tablet 10 mg traZODone (DESYREL) tablet 25 mg  Technical Summary: This is a multichannel digital EEG recording, using the international 10-20 placement system with electrodes applied with paste and impedances below 5000 ohms.    Description: The EEG background is symmetric with generalized background slowing of delta and theta activity, with posterior dominant rhythm occasionally reaching 8 Hz.  No focal abnormalities are seen.  No focal or generalized epileptiform discharges are seen.  Stage II sleep is not seen.  Hyperventilation and photic stimulation were not performed.  ECG revealed normal cardiac rate and rhythm.  Impression: This is an abnormal routine EEG of the awake and drowsy states due to generalized background slowing.  This is a nonspecific finding indicative of encephalopathy, which may be due to toxic-metabolic, pharmacologic, infectious or other diffuse physiologic abnormality.  Doreena Maulden R. Everlena CooperJaffe, DO

## 2015-08-17 NOTE — Progress Notes (Signed)
EEG Completed; Results Pending  

## 2015-08-18 LAB — BASIC METABOLIC PANEL
ANION GAP: 6 (ref 5–15)
BUN: 18 mg/dL (ref 6–20)
CHLORIDE: 108 mmol/L (ref 101–111)
CO2: 27 mmol/L (ref 22–32)
Calcium: 9.5 mg/dL (ref 8.9–10.3)
Creatinine, Ser: 0.88 mg/dL (ref 0.44–1.00)
GFR calc Af Amer: >60 mL/min (ref >60)
GLUCOSE: 92 mg/dL (ref 65–99)
POTASSIUM: 3.8 mmol/L (ref 3.5–5.1)
SODIUM: 141 mmol/L (ref 135–145)

## 2015-08-18 LAB — CBC
HCT: 25.9 % — ABNORMAL LOW (ref 36.0–46.0)
HEMOGLOBIN: 8.4 g/dL — AB (ref 12.0–15.0)
MCH: 30.5 pg (ref 26.0–34.0)
MCHC: 32.4 g/dL (ref 30.0–36.0)
MCV: 94.2 fL (ref 78.0–100.0)
PLATELETS: 82 10*3/uL — AB (ref 150–400)
RBC: 2.75 MIL/uL — AB (ref 3.87–5.11)
RDW: 16.5 % — ABNORMAL HIGH (ref 11.5–15.5)
WBC: 2.3 10*3/uL — AB (ref 4.0–10.5)

## 2015-08-18 MED ORDER — HEPARIN SOD (PORK) LOCK FLUSH 100 UNIT/ML IV SOLN
250.0000 [IU] | INTRAVENOUS | Status: AC | PRN
  Administered 2015-08-18: 250 [IU]

## 2015-08-18 MED ORDER — LEVETIRACETAM 750 MG PO TABS: 1500.0000 mg | ORAL_TABLET | Freq: Two times a day (BID) | ORAL | Status: AC

## 2015-08-18 MED ORDER — ALPRAZOLAM 0.25 MG PO TABS: 0.2500 mg | ORAL_TABLET | Freq: Three times a day (TID) | ORAL | Status: AC | PRN

## 2015-08-18 MED ORDER — LACOSAMIDE 50 MG PO TABS: 50.0000 mg | ORAL_TABLET | Freq: Two times a day (BID) | ORAL | Status: AC

## 2015-08-18 MED ORDER — ENSURE ENLIVE PO LIQD: 237.0000 mL | Freq: Two times a day (BID) | ORAL | Status: AC

## 2015-08-18 MED ORDER — OXYCODONE HCL 5 MG PO TABS: 10.0000 mg | ORAL_TABLET | ORAL | Status: AC | PRN

## 2015-08-18 NOTE — Progress Notes (Signed)
[facility] arrived [facility] [place]. [facility].

## 2015-08-18 NOTE — Discharge Summary (Signed)
Physician Discharge Summary  Rhonda Vargas BJY:[id number] DOB: Sep 06, 1960 DOA: 08/13/2015  PCP: No primary care provider on file.  Admit date: 08/13/2015 Discharge date: 08/18/2015   Recommendations for Outpatient Follow-Up:   1. F/U with PCP in 1 week to evaluate medication changes/tolerance.   Discharge Diagnosis:   Principal Problem:    Seizures (HCC) Active Problems:    Facial droop    Confusion state    Substance abuse    Anxiety    HTN (hypertension)    Chronic hepatitis (HCC)    Altered mental status    Pancytopenia (HCC)    Hypothyroidism    Seizure [facility])   Discharge disposition:  SNF: [facility].  Discharge Condition: Improved.  Diet recommendation:  Regular.  Wound care: Resume pre-admission orders for wound care.   History of Present Illness:   Rhonda Vargas is an 55 y.o. female with a PMH of polysubstance abuse including cocaine, psychiatric illness including suicide attempts, prior incarcerations, chronic hepatitis B, seizure disorder maintained on Keppra who was admitted to [facility] 08/13/15 with multiple seizures. She was seen by a neurologist at that facility, Dr. Fannie Knee increased her Keppra dose to 1500 mg twice a day. An MRI showed right insular signal changes but no findings to suggest herpes encephalitis. She subsequently was transferred to [facility] for further neurological input. She was empirically placed on acyclovir until herpes encephalitis could be definitively ruled out. Vimpat was also added to her regimen.  Hospital Course by Problem:   Principal Problem:  Seizures (HCC) - Continue Keppra and Vimpat. No further seizures. - EEG done 08/17/15, showed generalized background slowing consistent with encephalopathy. - Consider possible pseudoseizures. - No current indication for LP per neurology or other neurological intervention.  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   Confusion state/altered mental status -  Likely secondary to post ictal state. Resolved.   Substance abuse - Patient reports no cocaine use in one year. Urine drug screen positive for benzodiazepines.   Anxiety/psychiatric illness - Continue Xanax. Continue Zyprexa.   Chronic hepatitis (HCC)/history of hepatitis C and chronic hepatitis B cirrhosis - Followed by ID at [facility]. Recommend appropriate F/U as indicated.   Pancytopenia (HCC) - Likely related to history of cirrhosis. Counts stable.   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 Wound care per their recommendations.   Hypothyroidism - Continue Synthroid.    Medical Consultants:    Neurology: Dr. Noel Christmas   Discharge Exam:   Filed Vitals:   08/18/15 0118 08/18/15 0531  BP: 113/59 118/70  Pulse: 60 60  Temp: 98.3 F (36.8 C) 98.4 F (36.9 C)  Resp: 18 20   Filed Vitals:   08/17/15 1820 08/17/15 2159 08/18/15 0118 08/18/15 0531  BP: 120/68 104/93 113/59 118/70  Pulse: 60 60 60 60  Temp: 98 F (36.7 C) 98.3 F (36.8 C) 98.3 F (36.8 C) 98.4 F (36.9 C)  TempSrc: Oral Oral Oral Oral  Resp: 20 18 18 20   Height:      Weight:      SpO2: 98% 98% 96% 97%    Gen:  NAD Cardiovascular:  RRR, No M/R/G Respiratory: Lungs CTAB Gastrointestinal: Abdomen soft, NT/ND with normal active bowel sounds. Extremities: No C/E/C, left hand dressings intact   The results of significant diagnostics from this hospitalization (including imaging, microbiology, ancillary and laboratory) are listed below for reference.     Procedures and Diagnostic Studies:  Mr Rhonda Vargas Wo Contrast  08/17/2015  CLINICAL DATA:  Initial evaluation for seizures. Further evaluation of previously question subinsular signal abnormality seen on recent brain MRI from 08/13/2015. EXAM: MRI HEAD WITHOUT AND WITH CONTRAST TECHNIQUE: Multiplanar, multiecho pulse sequences of the brain and surrounding structures were  obtained without and with intravenous contrast. CONTRAST:  10mL MULTIHANCE GADOBENATE DIMEGLUMINE 529 MG/ML IV SOLN COMPARISON:  Prior study from 08/13/2015. FINDINGS: Today's study is of higher quality the relative to recent exam with marked fluid Lee less motion artifact. Cerebral volume within normal limits for patient age. Mild patchy T2/FLAIR hyperintensity within the periventricular and deep white matter both cerebral hemispheres most likely related to chronic small vessel ischemic disease, fairly mild in nature. Previously questioned signal abnormality within the subinsular region on the right is not seen on today's study. This was most likely artifactual in nature on prior exam. No signal changes within either temporal regions seen to suggest HSV encephalitis or other acute abnormality. No other focal parenchymal signal abnormality identified. No abnormal foci of restricted diffusion to suggest acute intracranial infarct. Gray-white matter differentiation maintained. Major intracranial vascular flow voids are preserved. No acute or chronic intracranial hemorrhage. No mass lesion, midline shift, or mass effect. No hydrocephalus. No extra-axial fluid collection. No abnormal enhancement small nonenhancing perivascular space noted at the right cerebral peduncle. Major dural sinuses are grossly patent. Craniocervical junction within normal limits. Pituitary gland within normal limits. No acute abnormality about the orbits. Paranasal sinuses are clear.  No mastoid effusion. Middle ear structures grossly normal. Bone marrow signal is is a within normal limits. No scalp soft tissue abnormality. IMPRESSION: 1. No acute intracranial abnormality identified. Previously questioned signal abnormality within the subinsular region not seen on today's exam. This was most likely artifactual on prior study. 2. Previously noted cystic lesion at the right cerebral peduncle demonstrates no associated enhancement, and is most  consistent with a small dilated perivascular space. 3. Mild chronic small vessel ischemic disease. Electronically Signed   By: Rise Mu M.D.   On: 08/17/2015 03:31     Labs:   Basic Metabolic Panel:  Recent Labs Lab 08/13/15 2120 08/14/15 0518 08/18/15 0433  NA 141 144 141  K 3.8 3.6 3.8  CL 111 112* 108  CO2 GLUCOSE 102* 91 92  BUN CREATININE 0.78 0.72 0.88  CALCIUM 8.7* 8.9 9.5   GFR Estimated Creatinine Clearance: 68.6 mL/min (by C-G formula based on Cr of 0.88). Liver Function Tests:  Recent Labs Lab 08/13/15 2120  AST 27  ALT 36  ALKPHOS 71  BILITOT 0.5  PROT 6.5  ALBUMIN 3.3*   No results for input(s): LIPASE, AMYLASE in the last 168 hours. No results for input(s): AMMONIA in the last 168 hours. Coagulation profile  Recent Labs Lab 08/13/15 2120 08/16/15 1635  INR 1.14 1.15    CBC:  Recent Labs Lab 08/13/15 2120 08/14/15 0518 08/18/15 0433  WBC 1.8* 2.0* 2.3*  NEUTROABS 0.8*  --   --   HGB 8.4* 8.0* 8.4*  HCT 26.2* 25.0* 25.9*  MCV 96.3 96.2 94.2  PLT 77* 84* 82*   Cardiac Enzymes:  Recent Labs Lab 08/13/15 2120  TROPONINI <0.03   BNP: Invalid input(s): POCBNP CBG:  Recent Labs Lab 08/15/15 1957  GLUCAP 132*   D-Dimer No results for input(s): DDIMER in the last 72 hours. Hgb A1c No results for input(s): HGBA1C in the last 72 hours. Lipid Profile No results  for input(s): CHOL, HDL, LDLCALC, TRIG, CHOLHDL, LDLDIRECT in the last 72 hours. Thyroid function studies No results for input(s): TSH, T4TOTAL, T3FREE, THYROIDAB in the last 72 hours.  Invalid input(s): FREET3 Anemia work up No results for input(s): VITAMINB12, FOLATE, FERRITIN, TIBC, IRON, RETICCTPCT in the last 72 hours. Microbiology Recent Results (from the past 240 hour(s))  Culture, blood (Routine X 2) w Reflex to ID Panel     Status: None (Preliminary result)   Collection Time: 08/14/15 12:55 AM  Result Value Ref Range Status    Specimen Description BLOOD PICC LINE DRAWN BY RN  Final   Special Requests BOTTLES DRAWN AEROBIC AND ANAEROBIC 7CC EACH  Final   Culture NO GROWTH 4 DAYS  Final   Report Status PENDING  Incomplete  Culture, blood (Routine X 2) w Reflex to ID Panel     Status: None (Preliminary result)   Collection Time: 08/14/15  1:18 AM  Result Value Ref Range Status   Specimen Description BLOOD PICC LINE DRAWN BY RN JJ  Final   Special Requests BOTTLES DRAWN AEROBIC AND ANAEROBIC 9CC EACH  Final   Culture NO GROWTH 4 DAYS  Final   Report Status PENDING  Incomplete  MRSA PCR Screening     Status: None   Collection Time: 08/14/15  7:05 PM  Result Value Ref Range Status   MRSA by PCR NEGATIVE NEGATIVE Final    Comment:        The GeneXpert MRSA Assay (FDA approved for NASAL specimens only), is one component of a comprehensive MRSA colonization surveillance program. It is not intended to diagnose MRSA infection nor to guide or monitor treatment for MRSA infections.      Discharge Instructions:   Discharge Instructions    Call MD for:    Complete by:  As directed   Recurrent seizures.     Diet general    Complete by:  As directed      Increase activity slowly    Complete by:  As directed             Medication List    TAKE these medications        ALPRAZolam 0.25 MG tablet  Commonly known as:  XANAX  Take 1 tablet (0.25 mg total) by mouth every 8 (eight) hours as needed for anxiety.     aspirin EC 81 MG tablet  Take 81 mg by mouth daily.     Chlorhexidine Gluconate 2 % Soln  Apply 1 application topically 3 (three) times daily. For cellulitis     feeding supplement (ENSURE ENLIVE) Liqd  Take 237 mLs by mouth 2 (two) times daily between meals.     ferrous sulfate 325 (65 FE) MG tablet  Take 325 mg by mouth daily with breakfast.     lacosamide 50 MG Tabs tablet  Commonly known as:  VIMPAT  Take 1 tablet (50 mg total) by mouth 2 (two) times daily.     levETIRAcetam 750 MG  tablet  Commonly known as:  KEPPRA  Take 2 tablets (1,500 mg total) by mouth 2 (two) times daily.     levothyroxine 200 MCG tablet  Commonly known as:  SYNTHROID, LEVOTHROID  Take 200 mcg by mouth daily before breakfast.     naproxen 375 MG tablet  Commonly known as:  NAPROSYN  Take 375 mg by mouth 2 (two) times daily with a meal.     OLANZapine 5 MG tablet  Commonly known as:  ZYPREXA  Take 2.5-5 mg by mouth 2 (two) times daily. Take 2.5mg  in the morning and 5mg  at bedtime     oxyCODONE 5 MG immediate release tablet  Commonly known as:  Oxy IR/ROXICODONE  Take 2 tablets (10 mg total) by mouth every 4 (four) hours as needed for moderate pain or severe pain.     traZODone 50 MG tablet  Commonly known as:  DESYREL  Take 50 mg by mouth at bedtime.           Follow-up Information    Follow up with PCP . Schedule an appointment as soon as possible for a visit in 1 week.   Why:  Hospital follow up, medication tolerance check.       Time coordinating discharge: 35 minutes.  Signed:  RAMA,CHRISTINA  Pager [phone] [facility] 08/18/2015, 10:31 AM

## 2015-08-18 NOTE — Discharge Instructions (Signed)

## 2015-08-18 NOTE — Clinical Social Work Note (Signed)
Patient agreeable to returning to SNF, [facility]. Clinical Social Worker has contacted pt's uncle, Rhonda Vargas in regards to discharge planning. Pt's uncle pleasant and agreeable to patient returning to SNF, [facility]. Pt's uncle appreciated social work intervention.  Facility aware of patient returning today, 3/7. No further concerns reported by patient or family at this time.    Clinical Social Worker facilitated patient discharge including contacting patient family and facility to confirm patient discharge plans.  Clinical information faxed to facility and family agreeable with plan.  CSW arranged ambulance transport via [facility] to [facility].  RN to call report prior to discharge.  Clinical Social Worker will sign off for now as social work intervention is no longer needed. Please consult us again if new need arises.  Rhonda Vargas, MSW, LCSWA [phone] 08/18/2015 3:07 PM

## 2015-08-18 NOTE — Care Management Note (Signed)
Case Management Note  Patient Details  Name: Rhonda Vargas MRN: [id number] Date of Birth: July 03, 1960  Subjective/Objective:                    Action/Plan: Patient discharging [facility] [place] today. No further needs per CM.   Expected Discharge Date:                  Expected Discharge Plan:     In-House Referral:     Discharge planning Services     Post Acute Care Choice:    Choice offered to:     DME Arranged:    DME Agency:     HH Arranged:    HH Agency:     Status of Service:  In process, will continue to follow  Medicare Important Message Given:    Date Medicare IM Given:    Medicare IM give by:    Date Additional Medicare IM Given:    Additional Medicare Important Message give by:     If discussed at Long Length of Stay Meetings, dates discussed:    Additional Comments:  Kermit BaloKelli F Keeara Frees, RN 08/18/2015, 3:14 PM

## 2015-08-18 NOTE — [place] FL2 (Signed)
[employer] MEDICAID FL2 LEVEL OF CARE SCREENING TOOL     IDENTIFICATION  Patient Name: Rhonda Vargas Birthdate: 1960-11-19 Sex: female Admission Date (Current Location): 08/13/2015  [place] and [place] Number:  Aaron Edelman [id number] Facility and Address:  [facility], [address] [place], [place] [address]      Provider Number: [id number]  Attending Physician Name and Address:  Maryruth Bun Rama, MD  Relative Name and Phone Number:   Barnetta Chapel- uncle, [phone])    Current Level of Care: Hospital Recommended Level of Care: Skilled Nursing Facility Prior Approval Number:    Date Approved/Denied:   PASRR Number:    Discharge Plan: SNF    Current Diagnoses: Patient Active Problem List   Diagnosis Date Noted  . Hypothyroidism 08/17/2015  . Seizure (HCC)   . Pancytopenia (HCC) 08/15/2015  . Facial droop 08/13/2015  . Seizures (HCC) 08/13/2015  . Confusion state 08/13/2015  . Substance abuse 08/13/2015  . Anxiety 08/13/2015  . HTN (hypertension) 08/13/2015  . Chronic hepatitis (HCC) 08/13/2015  . Altered mental status 08/13/2015    Orientation RESPIRATION BLADDER Height & Weight     Self, Time, Situation, Place  Normal Continent Weight: 139 lb (63.05 kg) Height:   (170.2 cm)  BEHAVIORAL SYMPTOMS/MOOD NEUROLOGICAL BOWEL NUTRITION STATUS      Continent Diet (GENERAL)  AMBULATORY STATUS COMMUNICATION OF NEEDS Skin       Surgical wounds (incision location: left finger.Marland Kitchen toe)                       Personal Care Assistance Level of Assistance              Functional Limitations Info             SPECIAL CARE FACTORS FREQUENCY                       Contractures      Additional Factors Info  Code Status, Allergies Code Status Info:  (FULL) Allergies Info:  (Other, Penicillin G, Vancomycin, Penicillins, Seroquel)           Current Medications (08/18/2015):  This is the current hospital active  medication list Current Facility-Administered Medications  Medication Dose Route Frequency Provider Last Rate Last Dose  . acyclovir (ZOVIRAX) 615 mg in dextrose 5 % 100 mL IVPB  10 mg/kg (Ideal) Intravenous 3 times per day Ram Daniel Nones, MD   615 mg at 08/18/15 1441  . aspirin EC tablet 81 mg  81 mg Oral Daily Houston Siren, MD   81 mg at 08/18/15 0953  . feeding supplement (ENSURE ENLIVE) (ENSURE ENLIVE) liquid 237 mL  237 mL Oral BID BM Erick Blinks, MD   237 mL at 08/18/15 1441  . lacosamide (VIMPAT) tablet 50 mg  50 mg Oral BID Ram Daniel Nones, MD   50 mg at 08/18/15 0953  . levETIRAcetam (KEPPRA) tablet 1,500 mg  1,500 mg Oral BID Beryle Beams, MD   1,500 mg at 08/18/15 0953  . levothyroxine (SYNTHROID, LEVOTHROID) tablet 200 mcg  200 mcg Oral QAC breakfast Houston Siren, MD   200 mcg at 08/18/15 0804  . LORazepam (ATIVAN) injection 0.5 mg  0.5 mg Intravenous Q4H PRN Ram Daniel Nones, MD   0.5 mg at 08/18/15 1042  . morphine 2 MG/ML injection 2 mg  2 mg Intravenous Q2H PRN Houston Siren, MD   2 mg at 08/18/15 1042  .  OLANZapine (ZYPREXA) tablet 5 mg  5 mg Oral QHS Standley Brookinganiel P Goodrich, MD   5 mg at 08/17/15 2237  . oxyCODONE (Oxy IR/ROXICODONE) immediate release tablet 10 mg  10 mg Oral Q4H PRN Houston SirenPeter Le, MD   10 mg at 08/18/15 [id number]  . sodium chloride flush (NS) 0.9 % injection 10-40 mL  10-40 mL Intracatheter PRN Ripudeep K Rai, MD      . sodium chloride flush (NS) 0.9 % injection 3 mL  3 mL Intravenous Q12H Houston SirenPeter Le, MD   3 mL at 08/18/15 1049  . traZODone (DESYREL) tablet 25 mg  25 mg Oral QHS Ram Daniel NonesNarayan Kaveer Nandigam, MD   25 mg at 08/17/15 2237     Discharge Medications: Please see discharge summary for a list of discharge medications.  Relevant Imaging Results:  Relevant Lab Results:   Additional Information  (SSN: [id number])  Orson Gearatiana Kieryn Burtis, Student-SW [phone]

## 2015-08-19 LAB — CULTURE, BLOOD (ROUTINE X 2)
CULTURE: NO GROWTH
CULTURE: NO GROWTH

## 2017-08-10 IMAGING — MR MR HEAD W/O CM
6 of 10 series · 21 of 48 positions shown · non-contrast
Comparison: None.

CLINICAL DATA: 55-year-old hypertensive female with history of
substance abuse and anemia presenting with left-sided facial droop.
Initial encounter.

EXAM:
MRI HEAD WITHOUT CONTRAST
TECHNIQUE: Multiplanar, multiecho pulse sequences of the brain and surrounding
structures were obtained without intravenous contrast.

[Series 5: T1 · sagittal · 5.0mm · 0.40mm/px · 3 of 21 slices shown (1 of 2)]
[im 1/21]
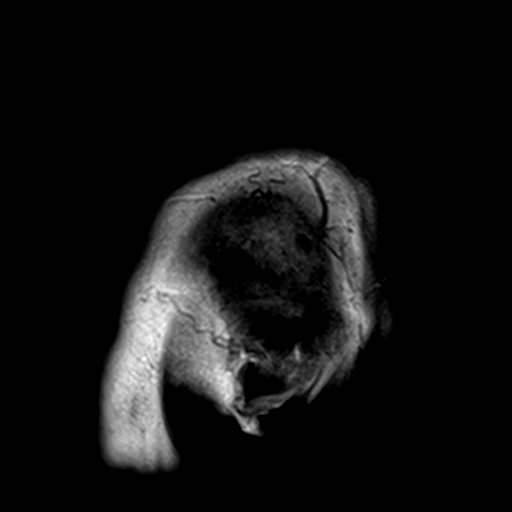
[im 11/21]
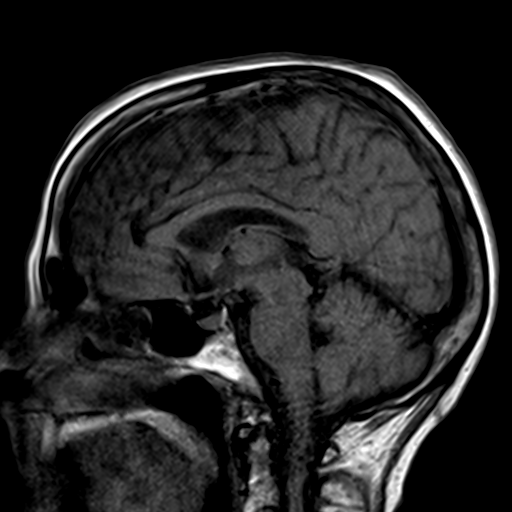
[im 21/21]
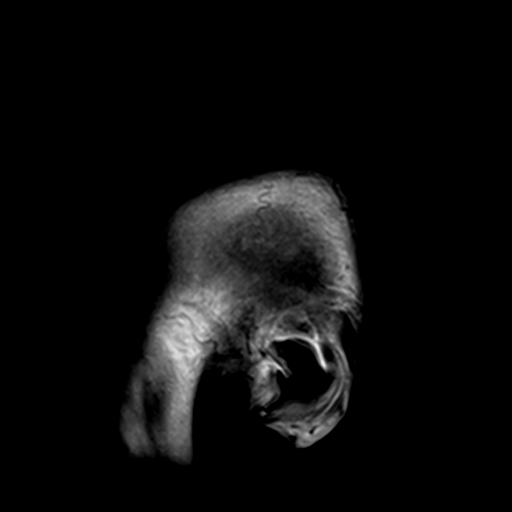

[Series 6: T2 · axial · 5.0mm · 0.49mm/px · z∈[-31,+111]mm · 3 of 23 slices shown (1 of 2)]
[im 1/23]
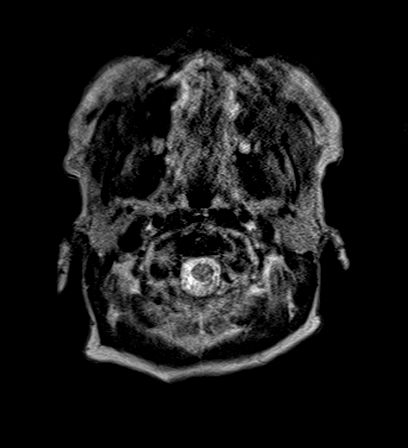
[im 12/23]
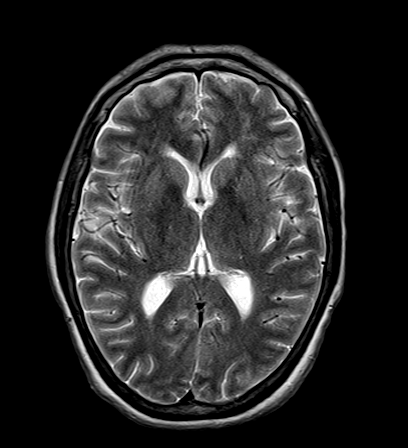
[im 23/23]
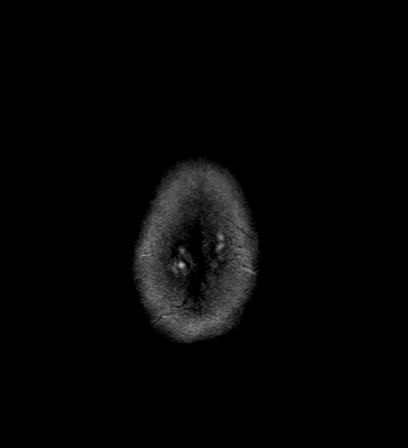

[Series 7: FLAIR · axial · 5.0mm · 0.34mm/px · z∈[-31,+112]mm · 3 of 23 slices shown]
[im 1/23]
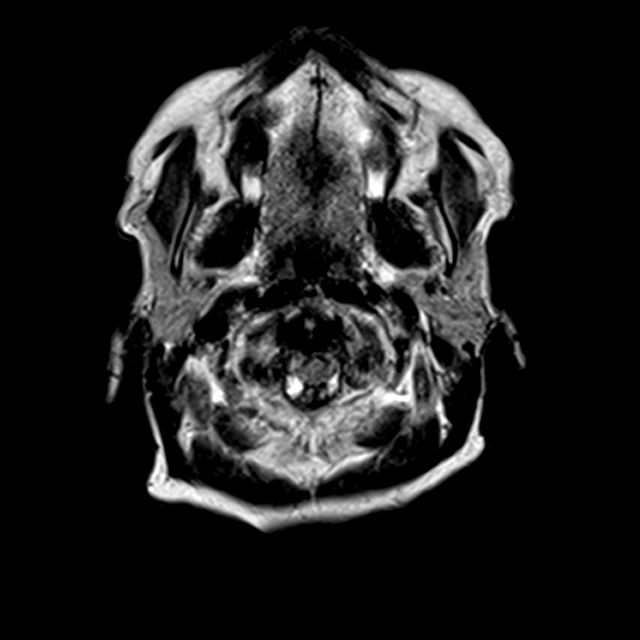
[im 12/23]
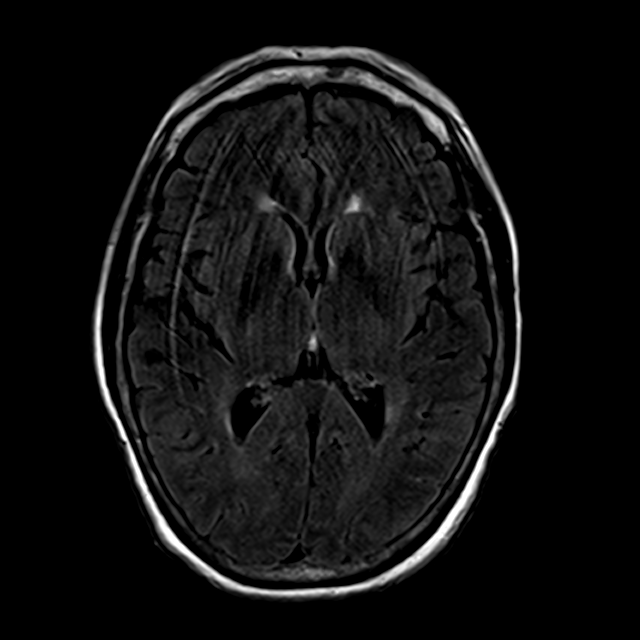
[im 23/23]
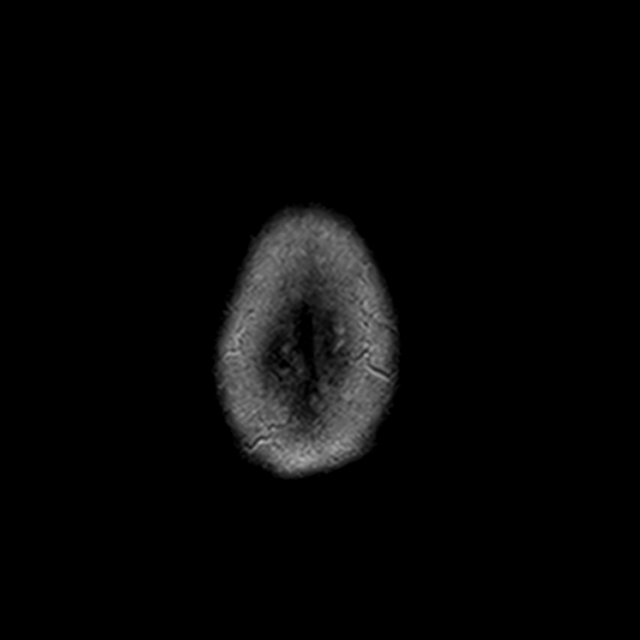

[Series 8: T1 · axial · 2.0mm · 0.42mm/px · z∈[-32,+111]mm · 8 of 73 slices shown (2 of 2)]
[im 1/73]
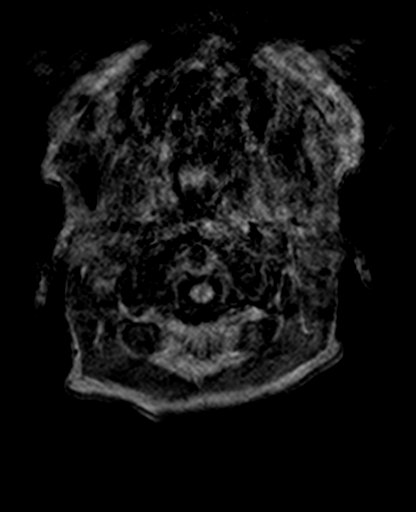
[im 10/73]
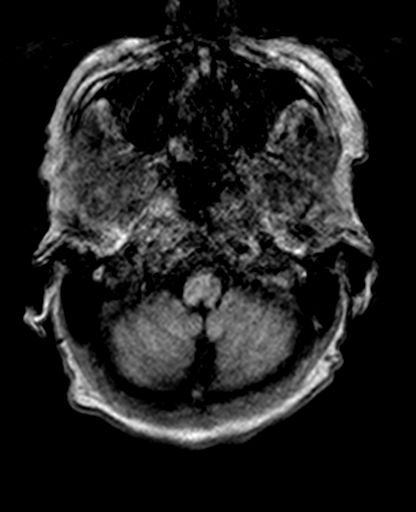
[im 19/73]
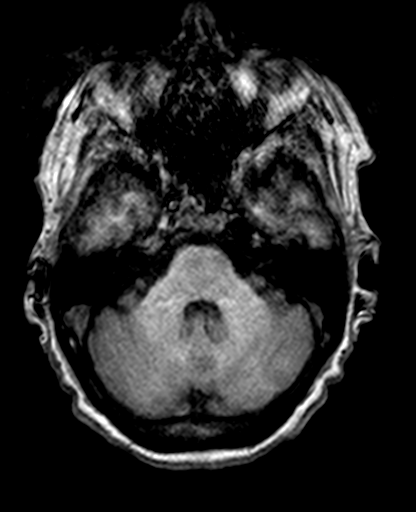
[im 28/73]
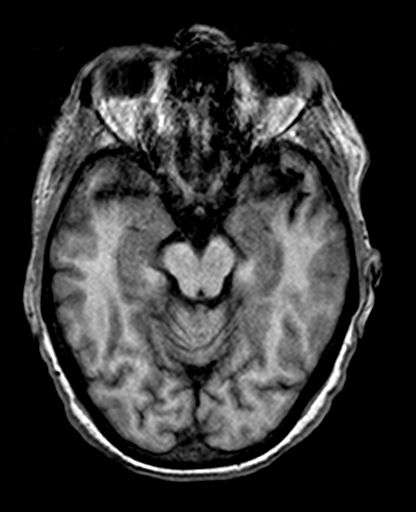
[im 46/73]
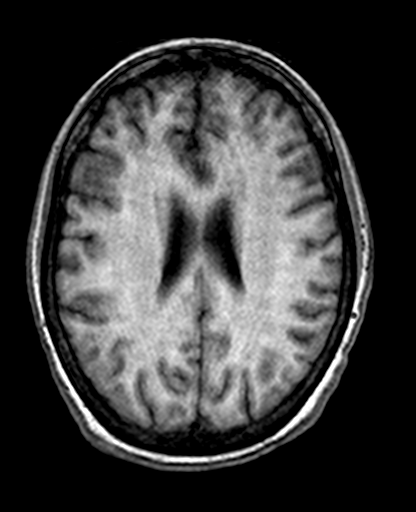
[im 55/73]
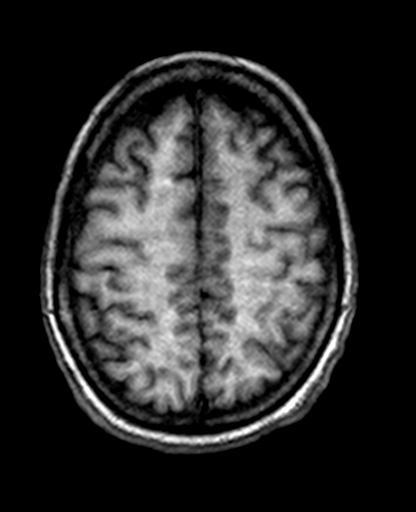
[im 64/73]
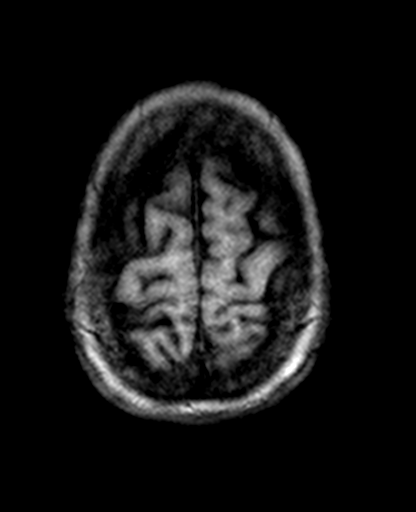
[im 73/73]
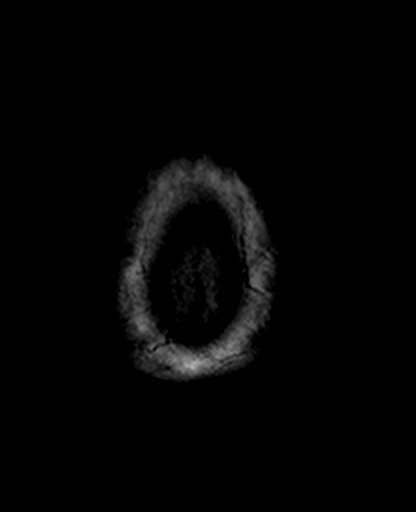

[Series 9: trauma axial · axial · 5.0mm · 0.41mm/px · 1 of 23 slices shown]
[im 1/23]
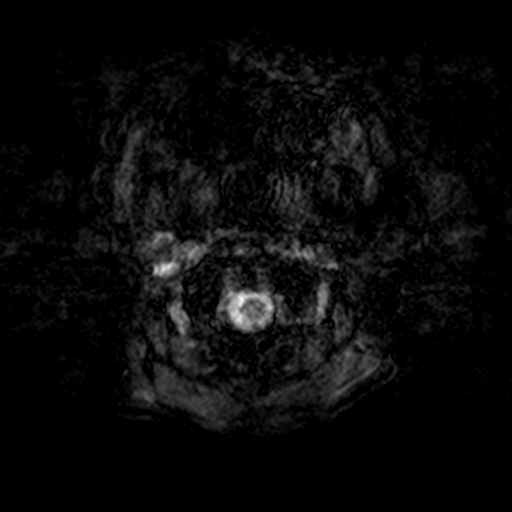

[Series 10: T2 · coronal · 5.0mm · 0.44mm/px · 3 of 28 slices shown (2 of 2)]
[im 1/28]
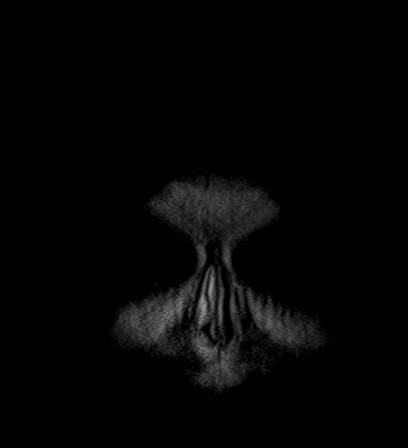
[im 14/28]
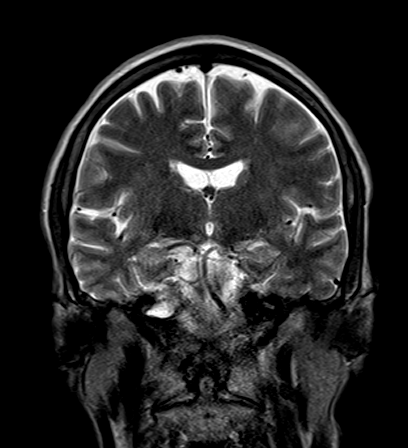
[im 28/28]
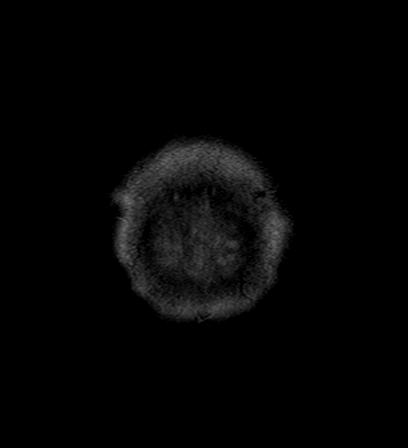

[21 of 48 positions shown; findings below may reference images not displayed]

FINDINGS: Exam is motion degraded.

No acute infarct or intracranial hemorrhage.

Right cerebral peduncle 5 x 4 x 4 mm cyst of indeterminate etiology.

Subinsular subtle altered signal intensity may be related to small
vessel disease/ motion without other findings to suggest herpes
encephalitis. Clinical correlation recommended.

Mild nonspecific white matter changes most prominent periventricular
region, suggestive of result of small vessel disease in this
hypertensive patient.

Major intracranial vascular structures are patent.

No hydrocephalus or age advanced atrophy.

Cervical medullary junction, pituitary region, pineal region and
orbital structures unremarkable.
IMPRESSION: Exam is motion degraded.

No acute infarct or intracranial hemorrhage.

Subinsular subtle altered signal intensity may be related to small
vessel disease/ motion without other findings to suggest herpes
encephalitis. Clinical correlation recommended.

Right cerebral peduncle 5 x 4 x 4 mm cystic appearing structure
probably an incidental finding such as a prominent peri vascular
space. If there were progressive symptoms, this could be evaluated
on follow-up exam.

Mild small vessel disease.

These results were called by telephone at the time of interpretation
on 08/13/2015 at [DATE] to [REDACTED] nurse , who verbally
acknowledged these results.

## 2018-01-11 DEATH — deceased
